# Patient Record
Sex: Female | Born: 1980 | Race: White | Hispanic: No | Marital: Single | Smoking: Former smoker
Health system: Southern US, Community
[De-identification: ages and names within clinical notes are randomized; demographics above are authoritative.]

## PROBLEM LIST (undated history)

## (undated) DIAGNOSIS — N200 Calculus of kidney: Secondary | ICD-10-CM

## (undated) DIAGNOSIS — K219 Gastro-esophageal reflux disease without esophagitis: Secondary | ICD-10-CM

## (undated) DIAGNOSIS — F319 Bipolar disorder, unspecified: Secondary | ICD-10-CM

## (undated) DIAGNOSIS — F209 Schizophrenia, unspecified: Secondary | ICD-10-CM

## (undated) DIAGNOSIS — N879 Dysplasia of cervix uteri, unspecified: Secondary | ICD-10-CM

## (undated) DIAGNOSIS — N289 Disorder of kidney and ureter, unspecified: Secondary | ICD-10-CM

## (undated) HISTORY — PX: KIDNEY STONE SURGERY: SHX686

## (undated) HISTORY — DX: Calculus of kidney: N20.0

## (undated) HISTORY — PX: FOOT SURGERY: SHX648

## (undated) HISTORY — DX: Dysplasia of cervix uteri, unspecified: N87.9

---

## 2002-05-27 ENCOUNTER — Other Ambulatory Visit: Admission: RE | Admit: 2002-05-27 | Discharge: 2002-05-27 | Payer: Self-pay

## 2004-02-22 ENCOUNTER — Ambulatory Visit (HOSPITAL_COMMUNITY): Admission: RE | Admit: 2004-02-22 | Discharge: 2004-02-22 | Payer: Self-pay | Admitting: Obstetrics and Gynecology

## 2004-03-16 ENCOUNTER — Ambulatory Visit (HOSPITAL_COMMUNITY): Admission: RE | Admit: 2004-03-16 | Discharge: 2004-03-16 | Payer: Self-pay | Admitting: Urology

## 2004-03-18 ENCOUNTER — Ambulatory Visit (HOSPITAL_COMMUNITY): Admission: RE | Admit: 2004-03-18 | Discharge: 2004-03-18 | Payer: Self-pay | Admitting: Urology

## 2004-03-30 ENCOUNTER — Ambulatory Visit (HOSPITAL_COMMUNITY): Admission: RE | Admit: 2004-03-30 | Discharge: 2004-03-30 | Payer: Self-pay | Admitting: Urology

## 2004-05-23 ENCOUNTER — Ambulatory Visit (HOSPITAL_COMMUNITY): Admission: RE | Admit: 2004-05-23 | Discharge: 2004-05-23 | Payer: Self-pay | Admitting: Urology

## 2004-09-24 IMAGING — CR DG ABDOMEN 1V
1 series · 1 of 1 positions shown · non-contrast
Comparison: none

CLINICAL DATA: Left renal calculus, preop.
 ABDOMEN, ONE VIEW ? 03/16/04
 There is a 17-mm stone projecting in the region of the left renal pelvis.  This is most compatible with a left renal calculus.  No additional calcifications are noted.  There is a normal bowel-gas pattern.  The bony structures are unremarkable.

[view not recorded]
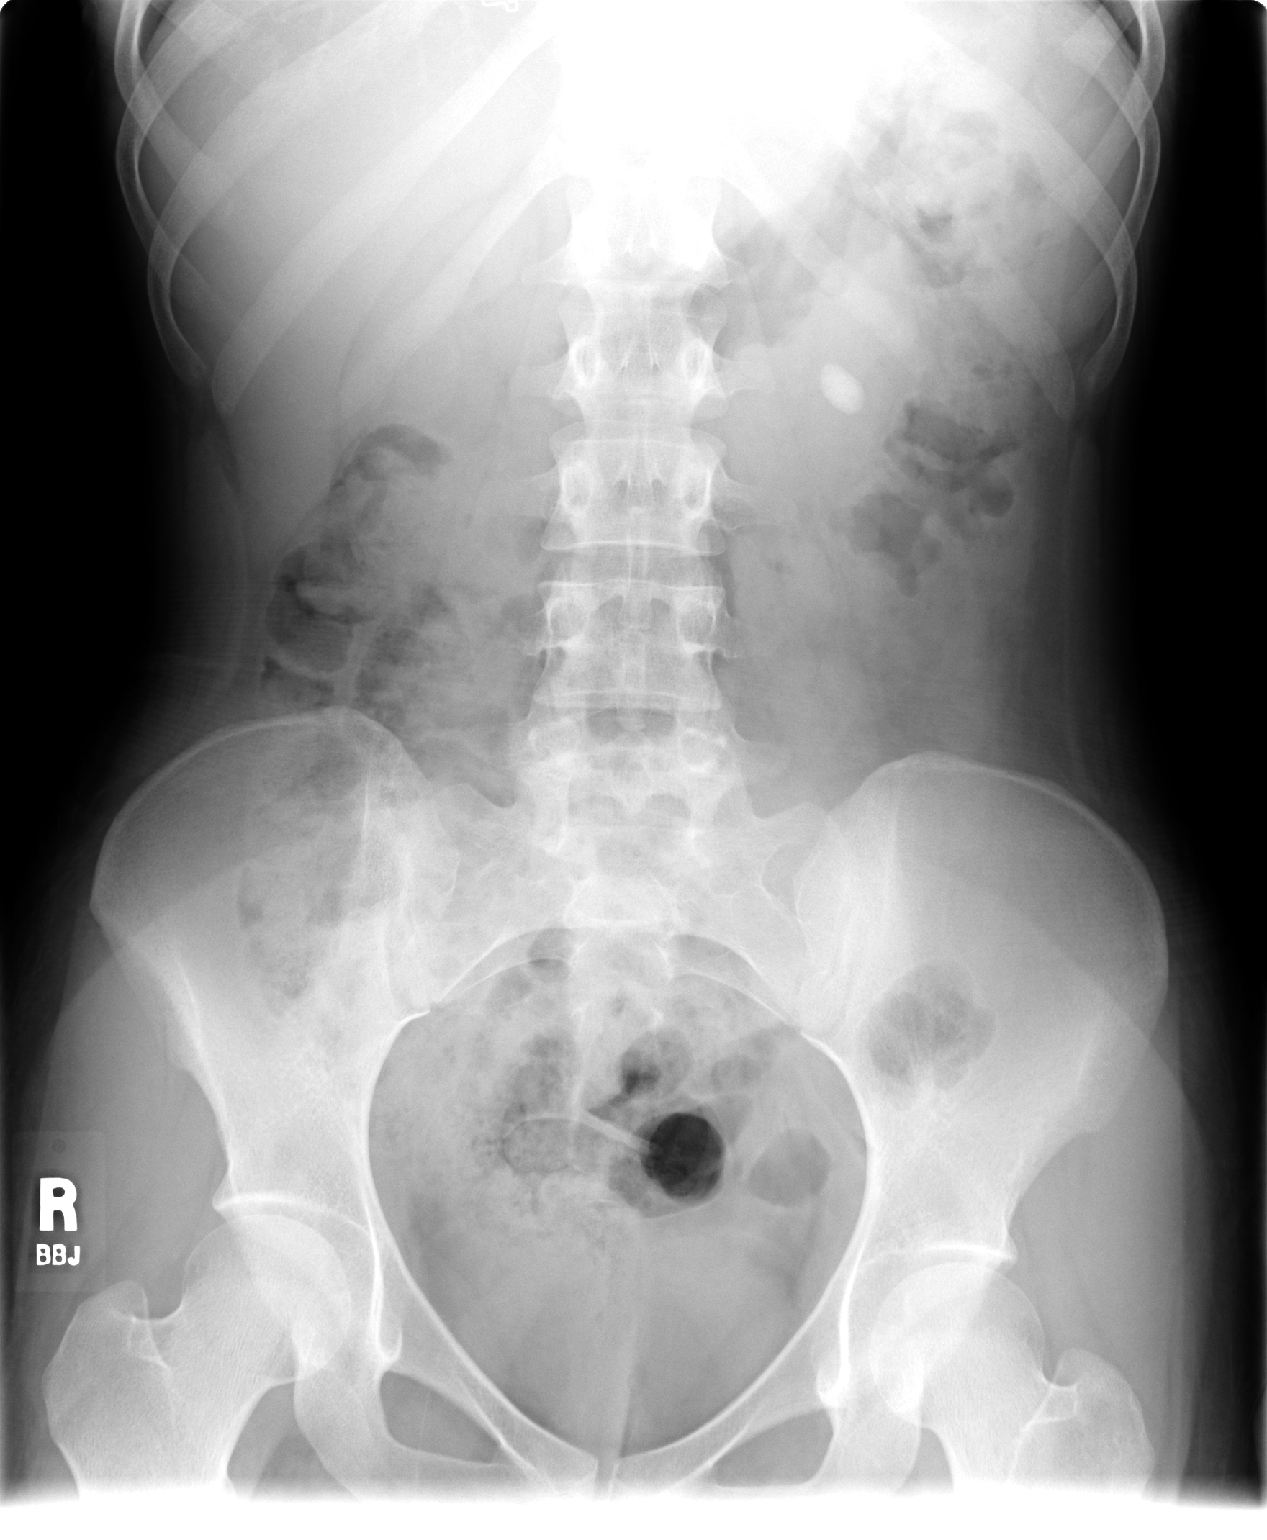

[1 of 1 positions shown; findings below may reference images not displayed]

IMPRESSION: A 17-mm stone projecting over the left kidney/renal pelvis.

## 2004-09-26 IMAGING — CR DG ABDOMEN 1V
1 series · 1 of 1 positions shown · non-contrast
Comparison: none

CLINICAL DATA: Left renal calculus.
 SINGLE VIEW ABDOMEN
 Comparison 03/16/04.
 Left renal calculus is no longer visible.  There are numerous tiny fragments in the distal ureter on the left (steinstrasse).  
 IMPRESSION
 Left lithotripsy with distal ureteral fragments on the left.

[view not recorded]
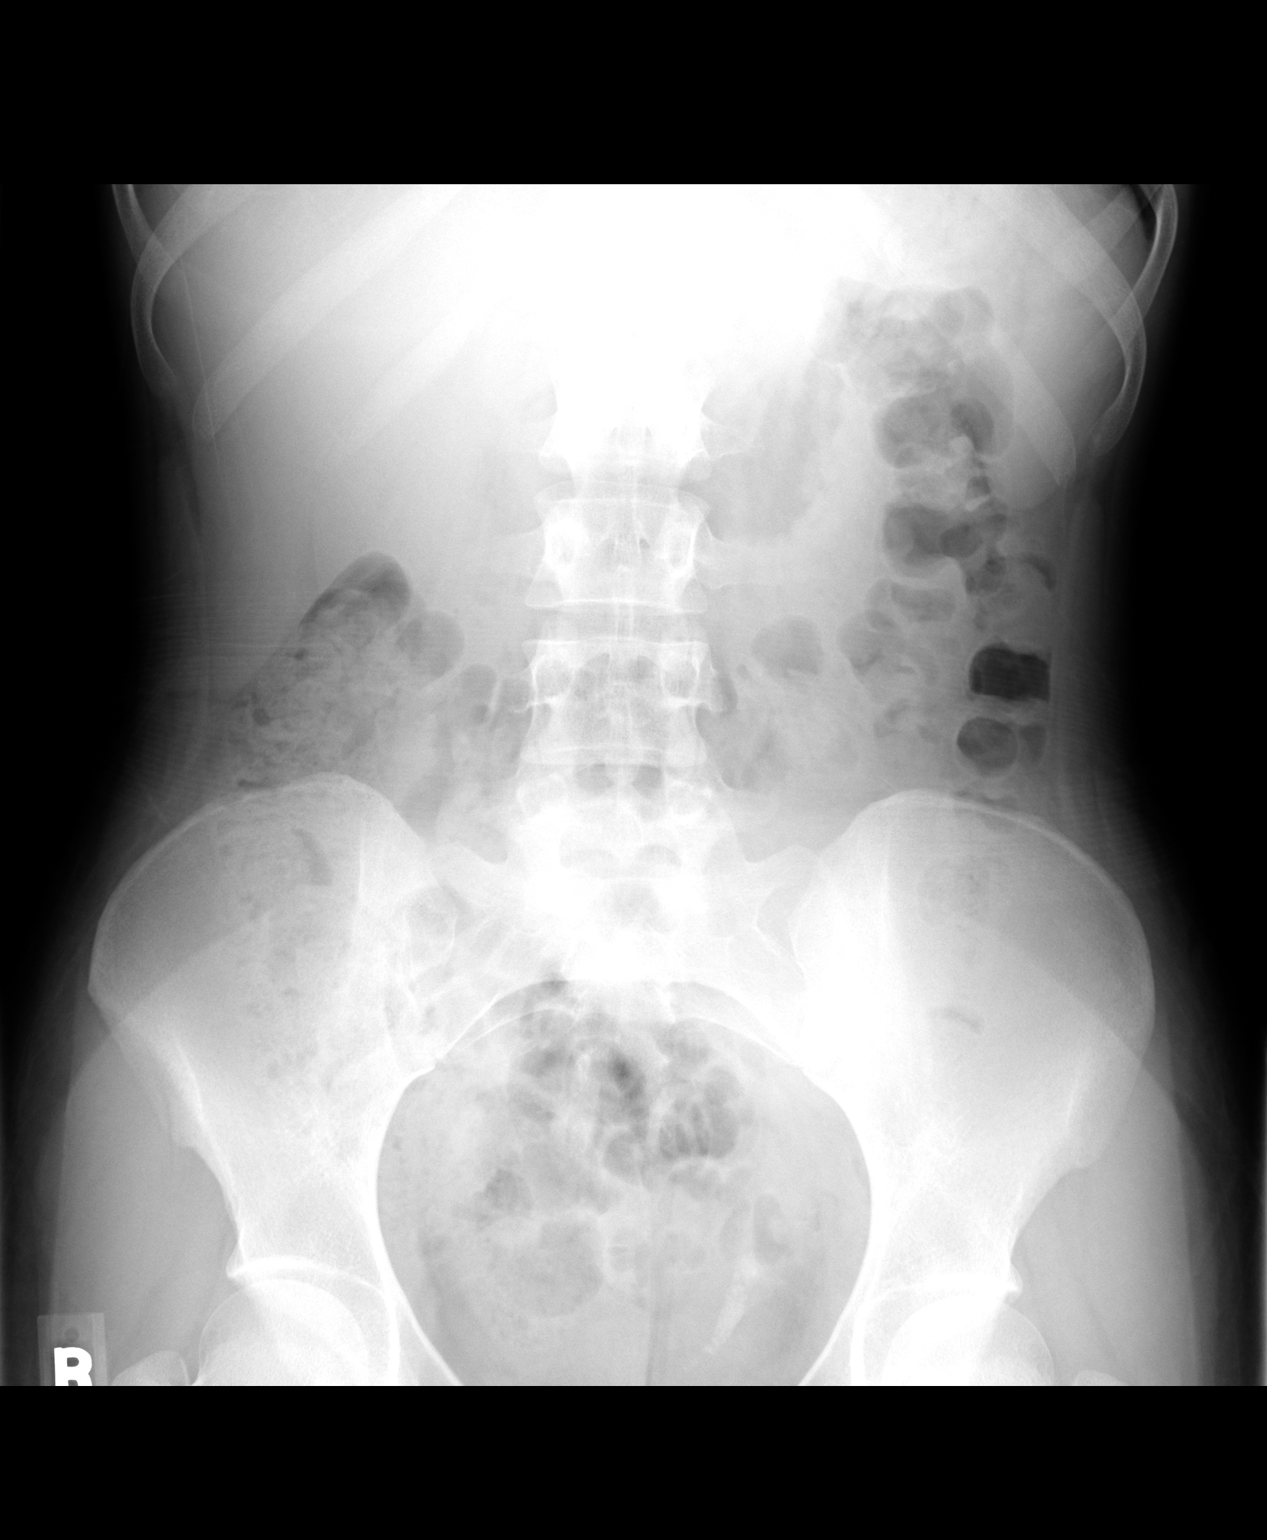

[1 of 1 positions shown; findings below may reference images not displayed]

## 2005-01-12 ENCOUNTER — Other Ambulatory Visit: Admission: RE | Admit: 2005-01-12 | Discharge: 2005-01-12 | Payer: Self-pay

## 2005-10-31 ENCOUNTER — Emergency Department (HOSPITAL_COMMUNITY): Admission: EM | Admit: 2005-10-31 | Discharge: 2005-10-31 | Payer: Self-pay | Admitting: Emergency Medicine

## 2005-12-19 ENCOUNTER — Ambulatory Visit (HOSPITAL_COMMUNITY): Admission: RE | Admit: 2005-12-19 | Discharge: 2005-12-19 | Payer: Self-pay

## 2007-02-15 ENCOUNTER — Ambulatory Visit (HOSPITAL_COMMUNITY): Admission: RE | Admit: 2007-02-15 | Discharge: 2007-02-15 | Payer: Self-pay | Admitting: Obstetrics and Gynecology

## 2007-04-09 ENCOUNTER — Ambulatory Visit: Payer: Self-pay | Admitting: Psychiatry

## 2007-04-10 ENCOUNTER — Inpatient Hospital Stay (HOSPITAL_COMMUNITY): Admission: AD | Admit: 2007-04-10 | Discharge: 2007-04-15 | Payer: Self-pay | Admitting: Psychiatry

## 2007-11-13 ENCOUNTER — Ambulatory Visit (HOSPITAL_COMMUNITY): Admission: RE | Admit: 2007-11-13 | Discharge: 2007-11-13 | Payer: Self-pay | Admitting: Obstetrics and Gynecology

## 2008-02-08 ENCOUNTER — Emergency Department (HOSPITAL_COMMUNITY): Admission: EM | Admit: 2008-02-08 | Discharge: 2008-02-09 | Payer: Self-pay | Admitting: Emergency Medicine

## 2009-08-03 ENCOUNTER — Encounter (INDEPENDENT_AMBULATORY_CARE_PROVIDER_SITE_OTHER): Payer: Self-pay | Admitting: Unknown Physician Specialty

## 2009-08-03 ENCOUNTER — Other Ambulatory Visit: Admission: RE | Admit: 2009-08-03 | Discharge: 2009-08-03 | Payer: Self-pay | Admitting: Unknown Physician Specialty

## 2010-07-20 ENCOUNTER — Emergency Department (HOSPITAL_COMMUNITY): Admission: EM | Admit: 2010-07-20 | Discharge: 2010-07-21 | Payer: Self-pay | Admitting: Emergency Medicine

## 2010-07-26 ENCOUNTER — Emergency Department (HOSPITAL_COMMUNITY): Admission: EM | Admit: 2010-07-26 | Discharge: 2010-07-27 | Payer: Self-pay | Admitting: Emergency Medicine

## 2010-09-29 ENCOUNTER — Emergency Department (HOSPITAL_COMMUNITY): Admission: EM | Admit: 2010-09-29 | Discharge: 2010-09-30 | Payer: Self-pay | Admitting: Emergency Medicine

## 2010-12-25 ENCOUNTER — Encounter: Payer: Self-pay | Admitting: Unknown Physician Specialty

## 2011-01-08 ENCOUNTER — Emergency Department (HOSPITAL_COMMUNITY)
Admission: EM | Admit: 2011-01-08 | Discharge: 2011-01-08 | Disposition: A | Discharge status: Home / Self Care | Payer: Medicaid Other | Attending: Emergency Medicine | Admitting: Emergency Medicine

## 2011-01-08 DIAGNOSIS — IMO0002 Reserved for concepts with insufficient information to code with codable children: Secondary | ICD-10-CM | POA: Insufficient documentation

## 2011-01-08 DIAGNOSIS — Y92009 Unspecified place in unspecified non-institutional (private) residence as the place of occurrence of the external cause: Secondary | ICD-10-CM | POA: Insufficient documentation

## 2011-01-08 DIAGNOSIS — W260XXA Contact with knife, initial encounter: Secondary | ICD-10-CM | POA: Insufficient documentation

## 2011-01-08 DIAGNOSIS — S61409A Unspecified open wound of unspecified hand, initial encounter: Secondary | ICD-10-CM | POA: Insufficient documentation

## 2011-01-08 DIAGNOSIS — M79609 Pain in unspecified limb: Secondary | ICD-10-CM | POA: Insufficient documentation

## 2011-01-18 ENCOUNTER — Emergency Department (HOSPITAL_COMMUNITY)
Admission: EM | Admit: 2011-01-18 | Discharge: 2011-01-18 | Disposition: A | Discharge status: Home / Self Care | Payer: Medicaid Other | Attending: Emergency Medicine | Admitting: Emergency Medicine

## 2011-01-18 DIAGNOSIS — Z4802 Encounter for removal of sutures: Secondary | ICD-10-CM | POA: Insufficient documentation

## 2011-02-15 LAB — URINALYSIS, ROUTINE W REFLEX MICROSCOPIC
Bilirubin Urine: NEGATIVE
Glucose, UA: NEGATIVE mg/dL
Ketones, ur: NEGATIVE mg/dL
Leukocytes, UA: NEGATIVE
Nitrite: NEGATIVE
Protein, ur: NEGATIVE mg/dL
Specific Gravity, Urine: 1.015 (ref 1.005–1.030)
Urobilinogen, UA: 0.2 mg/dL (ref 0.0–1.0)
pH: 7.5 (ref 5.0–8.0)

## 2011-02-15 LAB — URINE MICROSCOPIC-ADD ON

## 2011-02-15 LAB — POCT PREGNANCY, URINE: Preg Test, Ur: NEGATIVE

## 2011-02-16 LAB — RAPID URINE DRUG SCREEN, HOSP PERFORMED
Amphetamines: NOT DETECTED
Barbiturates: NOT DETECTED
Benzodiazepines: NOT DETECTED
Cocaine: NOT DETECTED
Opiates: NOT DETECTED
Tetrahydrocannabinol: NOT DETECTED

## 2011-02-16 LAB — POCT PREGNANCY, URINE: Preg Test, Ur: NEGATIVE

## 2011-02-16 LAB — CBC
HCT: 38.7 % (ref 36.0–46.0)
Hemoglobin: 13 g/dL (ref 12.0–15.0)
MCH: 31.3 pg (ref 26.0–34.0)
MCHC: 33.6 g/dL (ref 30.0–36.0)
MCV: 93.1 fL (ref 78.0–100.0)
Platelets: 296 10*3/uL (ref 150–400)
RBC: 4.16 MIL/uL (ref 3.87–5.11)
RDW: 14.5 % (ref 11.5–15.5)
WBC: 9.5 10*3/uL (ref 4.0–10.5)

## 2011-02-16 LAB — URINALYSIS, ROUTINE W REFLEX MICROSCOPIC
Bilirubin Urine: NEGATIVE
Glucose, UA: NEGATIVE mg/dL
Hgb urine dipstick: NEGATIVE
Ketones, ur: NEGATIVE mg/dL
Leukocytes, UA: NEGATIVE
Nitrite: POSITIVE — AB
Protein, ur: NEGATIVE mg/dL
Specific Gravity, Urine: 1.01 (ref 1.005–1.030)
Urobilinogen, UA: 0.2 mg/dL (ref 0.0–1.0)
pH: 6 (ref 5.0–8.0)

## 2011-02-16 LAB — BASIC METABOLIC PANEL
BUN: 9 mg/dL (ref 6–23)
CO2: 30 mEq/L (ref 19–32)
Calcium: 9 mg/dL (ref 8.4–10.5)
Chloride: 103 mEq/L (ref 96–112)
Creatinine, Ser: 0.78 mg/dL (ref 0.4–1.2)
GFR calc Af Amer: >60 mL/min (ref >60)
GFR calc non Af Amer: >60 mL/min (ref >60)
Glucose, Bld: 146 mg/dL — ABNORMAL HIGH (ref 70–99)
Potassium: 3.6 mEq/L (ref 3.5–5.1)
Sodium: 137 mEq/L (ref 135–145)

## 2011-02-16 LAB — DIFFERENTIAL
Basophils Absolute: 0 10*3/uL (ref 0.0–0.1)
Basophils Relative: 0 % (ref 0–1)
Eosinophils Absolute: 0.1 10*3/uL (ref 0.0–0.7)
Eosinophils Relative: 1 % (ref 0–5)
Lymphocytes Relative: 39 % (ref 12–46)
Lymphs Abs: 3.7 10*3/uL (ref 0.7–4.0)
Monocytes Absolute: 0.5 10*3/uL (ref 0.1–1.0)
Monocytes Relative: 5 % (ref 3–12)
Neutro Abs: 5.1 10*3/uL (ref 1.7–7.7)
Neutrophils Relative %: 54 % (ref 43–77)

## 2011-02-16 LAB — URINE MICROSCOPIC-ADD ON

## 2011-02-16 LAB — ETHANOL: Alcohol, Ethyl (B): <5 mg/dL (ref 0–10)

## 2011-02-17 LAB — URINE MICROSCOPIC-ADD ON

## 2011-02-17 LAB — POCT I-STAT, CHEM 8
BUN: 7 mg/dL (ref 6–23)
Calcium, Ion: 1.09 mmol/L — ABNORMAL LOW (ref 1.12–1.32)
Chloride: 102 mEq/L (ref 96–112)
Creatinine, Ser: 0.7 mg/dL (ref 0.4–1.2)
Glucose, Bld: 129 mg/dL — ABNORMAL HIGH (ref 70–99)
HCT: 45 % (ref 36.0–46.0)
Hemoglobin: 15.3 g/dL — ABNORMAL HIGH (ref 12.0–15.0)
Potassium: 4.1 mEq/L (ref 3.5–5.1)
Sodium: 138 mEq/L (ref 135–145)
TCO2: 28 mmol/L (ref 0–100)

## 2011-02-17 LAB — CBC
HCT: 41.3 % (ref 36.0–46.0)
Hemoglobin: 12.7 g/dL (ref 12.0–15.0)
Hemoglobin: 14.2 g/dL (ref 12.0–15.0)
MCH: 30.7 pg (ref 26.0–34.0)
MCH: 31.1 pg (ref 26.0–34.0)
MCHC: 33.5 g/dL (ref 30.0–36.0)
MCHC: 34.4 g/dL (ref 30.0–36.0)
MCV: 90.4 fL (ref 78.0–100.0)
MCV: 91.5 fL (ref 78.0–100.0)
RBC: 4.11 MIL/uL (ref 3.87–5.11)
RBC: 4.57 MIL/uL (ref 3.87–5.11)
RDW: 14.6 % (ref 11.5–15.5)

## 2011-02-17 LAB — DIFFERENTIAL
Eosinophils Absolute: 0 10*3/uL (ref 0.0–0.7)
Eosinophils Relative: 0 % (ref 0–5)
Lymphs Abs: 2.4 10*3/uL (ref 0.7–4.0)
Monocytes Absolute: 0.8 10*3/uL (ref 0.1–1.0)
Monocytes Relative: 11 % (ref 3–12)

## 2011-02-17 LAB — URINALYSIS, ROUTINE W REFLEX MICROSCOPIC
Bilirubin Urine: NEGATIVE
Glucose, UA: NEGATIVE mg/dL
Hgb urine dipstick: NEGATIVE
Ketones, ur: NEGATIVE mg/dL
Protein, ur: NEGATIVE mg/dL

## 2011-02-17 LAB — RAPID URINE DRUG SCREEN, HOSP PERFORMED
Barbiturates: NOT DETECTED
Benzodiazepines: NOT DETECTED
Opiates: NOT DETECTED

## 2011-02-17 LAB — ETHANOL: Alcohol, Ethyl (B): <5 mg/dL (ref 0–10)

## 2011-02-17 LAB — VALPROIC ACID LEVEL: Valproic Acid Lvl: 63.9 ug/mL (ref 50.0–100.0)

## 2011-04-21 NOTE — Discharge Summary (Signed)
Diana [name], [name]               ACCOUNT NO.:  [id number]   MEDICAL RECORD NO.:  [id number]          PATIENT TYPE:  IPS   LOCATION:  0504                          FACILITY:  [facility]   PHYSICIAN:  Geoffery Lyons, M.D.      DATE OF BIRTH:  08-31-1981   DATE OF ADMISSION:  04/10/2007  DATE OF DISCHARGE:  04/15/2007                               DISCHARGE SUMMARY   CHIEF COMPLAINT AND PRESENT ILLNESS:  This was the first admission to  [facility] for this 30 year old white female, single,  involuntarily committed.  She was brought to the ED by law enforcement.  Apparently, her sister called after she threatened to harm herself and  other members of the family with a knife.  She is developmentally  disabled, and she claimed that the sister had been calling her names,  and was threatening to leave her home alone, which upset her.  She took  a knife and pointed it towards her chest.  Admits that she had intent of  stabbing herself only.  Denied any thoughts of harming other members of  the family.  Apparently the mother left home to go to visit an aunt over  a week prior to this admission.  She misses her mother.  Threat of her  sister leaving her home alone.   PAST PSYCHIATRIC HISTORY:  Denies previous treatment.   ALCOHOL AND DRUG HISTORY:  Denies any active use of any substances.   MEDICAL HISTORY:  Noncontributory.   MEDICATIONS:  Oral contraceptives, vitamin D supplements.   PHYSICAL EXAMINATION:  Performed.  Failed to show any acute findings.   LABORATORY WORKUP:  Sodium 139, potassium 3.9, glucose 90, SGOT 19, SGPT  14, total bilirubin 0.4, TSH 4.965.   MENTAL STATUS EXAM:  A fully alert female, pleasant, cooperative,  directable, somewhat limited, concrete but able to articulate her needs.  Some of psychomotor retardation.  Mood is depressed.  Affect  constricted.  Upset because she is apart from her mother.  Claimed that  the sister was calling her names, and  she got upset in the course of the  interview.  Starts crying, saying I miss my mom, I miss my mom.  Denied any denied any suicidal thoughts.  No homicidal thoughts.  No  evidence of delusions.  No hallucinations.  Cognition well-preserved.   ADMISSION DIAGNOSES:  AXIS I:  Major depressive disorder.  AXIS II:  Mental disability.  AXIS III:  No diagnosis.  AXIS IV:  Moderate.  AXIS V:  Upon admission 25.  Highest global assessment of functioning in  the lat year 55.   COURSE IN THE HOSPITAL:  She was admitted.  She was started in  individual and group psychotherapy.  She was given Ambien for sleep,  Ativan as needed, and due to a history of persistent mood fluctuation,  she was started on Depakote.  As already stated, 30 year old female,  single, very upset when apparently the sister called her names.  The  mother left to visit an aunt.  Will be coming back.  Took a knife, put  it  to her chest, threatened suicide as well as to hurt the sister, and  burn the house.  Involuntary petition.  She endorsed depression, being  anxious, more irritable.  We continued to evaluate, get more  information.  She apparently got upset with the sister.  She was also  responding to the sister's children being around.  Has history of anger  spells.  She __________  goes to her own room and stays by herself, but  endorsed that as of lately has been getting more difficult to deal with  the anger.  We pursued the Depakote.  She tolerated it well.  We worked  on Pharmacologist, Building surveyor.  There were a couple instances when  the mother was going to pick up and ended up not happening.  Got very  upset but she was able to deal with that.  She was able to accept this  frustration without going and losing control.  By May 12, she was in  full contact with reality.  There were no active suicidal/homicidal  ideas.  No hallucinations.  No delusions.  She felt she was ready to  move on, to go home.  She had  tolerated the medication well.  No  evidence of outbursts.  Seemed to be euthymic, so we went ahead and  discharged to outpatient followup.   DISCHARGE DIAGNOSES:  AXIS I:  Mood disorder not otherwise specified.  Rule out borderline intellectual functioning.  AXIS II:  No diagnosis.  AXIS III:  No diagnosis.  AXIS IV:  Moderate.  AXIS V:  Upon discharge 50-55.   Discharged on Depakote ER 250 in the morning and at bedtime.   FOLLOWUP:  __________  [facility].      Geoffery Lyons, M.D.  Electronically Signed     IL/MEDQ  D:  05/12/2007  T:  05/13/2007  Job:  [id number]

## 2011-04-21 NOTE — H&P (Signed)
NAME:  Diana [name], Diana [name]                         ACCOUNT NO.:  [id number]   MEDICAL RECORD NO.:  [id number]                   PATIENT TYPE:  AMB   LOCATION:  DAY                                  FACILITY:  [facility]   PHYSICIAN:  Dennie Maizes, M.D.                DATE OF BIRTH:  05-06-1981   DATE OF ADMISSION:  03/16/2004  DATE OF DISCHARGE:                                HISTORY & PHYSICAL   CHIEF COMPLAINT:  1. Back pain.  2. Microhematuria.  3. History of urinary tract infection.   HISTORY OF PRESENT ILLNESS:  This 30 year old female has been referred to me  by Ms. Myriam Jacobson from [facility].  [name] has  been having intermittent dysuria, back pain and intermittent right flank  pain for the past two months.  She has been treated for a urinary tract  infection about six months ago.  She also complains of urinary frequency x6  and nocturia x2-3.  She has noticed intermittent gross hematuria.  She did  not have any fever or chills.  She has some voiding difficulty and she has  to strain to empty the bladder.  There is no past history of urolithiasis.   PAST MEDICAL HISTORY:  Unremarkable.   MEDICATIONS:  None.   ALLERGIES:  NONE.   OPERATIONS:  Status post leg surgery during infancy.   FAMILY HISTORY:  Positive for kidney stones.   PHYSICAL EXAMINATION:  Height 5 feet 3 inches, weight 99.5 pounds.  HEENT:  Normal.  NECK:  No masses.  LUNGS:  Clear to auscultation.  HEART:  Regular rate and rhythm, no murmurs.  ABDOMEN:  Soft, no palpable frank mass or CVA tenderness.  BLADDER:  Not palpable.  No suprapubic tenderness.   A recent urine culture and sensitivity is negative.  She was evaluated with  a noncontrast CT of the abdomen and pelvis.  An x-ray of the KUB area was  also done.  These x-rays reveal a 16 x 10 mm sized left renal calculus.  There is no evidence of hydronephrosis.   IMPRESSION:  1. Large left renal calculus.  2. Hematuria.  3.  History of urinary tract infection.   PLAN:  Extracorporeal shock wave lithotripsy of a large left renal calculus  with IV sedation in day hospital.  I discussed with the patient and her  mother regarding the diagnosis, operative details, alternate treatments,  outcome, possible risks and complications and they have agreed for the  procedure to be done.     ___________________________________________                                         Dennie Maizes, M.D.   SK/MEDQ  D:  03/15/2004  T:  03/15/2004  Job:  [id number]   cc:   Ms. Lupita Leash  [facility]  [address]  [place], [place] [address]

## 2011-04-21 NOTE — H&P (Signed)
NAMEMarland Kitchen  Diana [name], Diana [name]               ACCOUNT NO.:  [id number]   MEDICAL RECORD NO.:  [id number]          PATIENT TYPE:  EMS   LOCATION:  ED                            FACILITY:  [facility]   PHYSICIAN:  Geoffery Lyons, M.D.      DATE OF BIRTH:  1981/09/02   DATE OF ADMISSION:  04/09/2007  DATE OF DISCHARGE:  04/10/2007                       PSYCHIATRIC ADMISSION ASSESSMENT   DATE OF ASSESSMENT:  Apr 10, 2007 at 10:10.   IDENTIFYING INFORMATION:  This is a 30 year old white female who is  single.  This is an involuntary admission.   HISTORY OF THE PRESENT ILLNESS:  This patient was petitioned by the  emergency room physician after she was brought there by law enforcement.  Her sister, Wyatt Mage, had apparently called the police after the patient  threatened to harm herself and other members of family with a knife.  The patient is developmentally disabled and reports today that her  sister had been calling her names and was threatening to leave her home  alone which upset her.  She took a knife and pointed it towards her  chest and admits that she had intent of stabbing herself and harming  herself.  She did not.  She denies any thoughts of harming other members  of the family.  She reports that her mother left home to go visit an  aunt about a week ago, that she misses her mother very much.  She is  afraid of her sister leaving her home alone.  She cries and says, I  miss my mother and reports that her mother will not be home until  Thursday.  She has been calm, cooperative, and directable with staff.  She is an unreliable historian secondary to her a poor insight and  developmental delay but is adequate in expressing her needs.   PAST PSYCHIATRIC HISTORY:  The patient denies any history of psychiatric  treatment.  No history of taking neuroleptics.  Has been enrolled in  Special Education throughout high school, now does a part-time job.  No  history of a substance abuse.  No history of  suicide attempts or acting  out or combativeness in the past.   SOCIAL HISTORY:  The patient is a single 30 year old female, never  married.  No children.  [place]  Layaan Mott, her adult sister,  who has three children in the home  and her mother who is currently absent on a visit to another relative.  The patient denies any legal problems.  Does not drive.  Walks to work.  Has her own room in the home.  Denies any history of substance abuse.  Her mother is her legal guardian.   FAMILY HISTORY:  Unavailable.   ALCOHOL AND DRUG HISTORY:  None.   MEDICAL HISTORY:  The patient is followed by Lupita Leash, a nurse practitioner  at a medical clinic, the identity is unclear.  Medical problems are none  known.   MEDICATIONS:  1. Oral contraceptive tablets which she takes one daily.  2. Vitamin D supplements dose unclear.   DRUG ALLERGIES:  IBUPROFEN  WHICH HAS BEEN KNOWN TO GIVE HER A RASH.   POSITIVE PHYSICAL FINDINGS:  This patient's full physical exam was done  in the emergency room at [facility].  She is a well-  nourished, well-developed female who is in no acute distress.  Her vital  signs were completely within normal limits.  She has a slight build,  healthy-appearing female, in no distress.  Pleasant, cooperative.  Five  feet two inches tall, 101 pounds, temperature 97, pulse 76, respirations  18, blood pressure 104/71.  No physical complaints today.   DIAGNOSTIC STUDIES:  CBC:  WBC 6.8, hemoglobin 12.5, hematocrit 35.7,  platelets 266,000.  Chemistry:  Sodium 139, potassium 3.9, chloride 102,  carbon dioxide 30, BUN 6, creatinine 0.75, and random glucose is 90.  Liver enzymes are all within normal limits.  Calcium normal.  TSH is  currently pending.  Her urine pregnancy test was negative.  Urine drug  screen negative for all substances.  Routine UA is unremarkable.   MENTAL STATUS EXAM:  A fully alert female.  She is  pleasant,  cooperative, directable, is quite limited but is polite, articulates her  needs.  Cognition is intact to person, place, and situation.  Does have  some limited insight and her thinking is very concrete.  She is, for  example, able to state her name and address, where she works,  does not  know the name of the medical clinic, or the last name of the nurse  practitioner that she sees, is not able to state her home phone number,  speech is slowed in pace and tone, normal in production, shy, thinks  things through quite a bit before she answers does require some  prompting but answers all questions, eye contact is quite good, mood is  depressed.  She is quite upset at being apart from her mother, and as  she talks about the events of yesterday she says that her sister was  calling her some names and yelling at her which got her upset and then  as she discusses this she cries more, just repeats, I miss my mom.  I  miss my mom.  She is not suicidal today.  Has been cooperative.  Feels  all right about staying here with [place] a couple days to sort the situation  out, rule out possible major depression, no homicidal thought, no  agitation, no signs of combativeness here, fully cooperative.   AXIS I:  Rule out major depression versus acute adjustment reaction.  AXIS II:  Deferred.  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  AXIS III:  No diagnosis.  AXIS IV:  Severe stress with change in primary caregiver.  AXIS V:  Current 23 past year 55 estimated.   PLAN:  To voluntarily admit the patient with every 15-minute checks in  place.  We  have given her Ambien 5 mg at hour of sleep p.r.n. for  insomnia if she needs it.  We are going to start by talking with her  family members and determine if she does have a long history of  dysphoric mood or other problems or if this is a relative recent reaction to current circumstances.  We will, after getting some more  information, we  can make a determination if we need to treat her for  depression.  We will start with calling her sister, Wyatt Mage, who is  currently at home and we will contact her legal guardian who is her  mother.  Estimated length of stay is 3-5 days.      Margaret A. Scott, N.P.      Geoffery Lyons, M.D.  Electronically Signed    MAS/MEDQ  D:  04/10/2007  T:  04/10/2007  Job:  [id number]

## 2011-08-28 LAB — RAPID URINE DRUG SCREEN, HOSP PERFORMED
Amphetamines: NOT DETECTED
Barbiturates: NOT DETECTED
Benzodiazepines: NOT DETECTED
Tetrahydrocannabinol: NOT DETECTED

## 2011-08-28 LAB — DIFFERENTIAL
Eosinophils Relative: 1
Lymphocytes Relative: 32
Lymphs Abs: 2.9
Monocytes Absolute: 0.7

## 2011-08-28 LAB — URINALYSIS, ROUTINE W REFLEX MICROSCOPIC
Glucose, UA: NEGATIVE
Hgb urine dipstick: NEGATIVE
Ketones, ur: NEGATIVE
Leukocytes, UA: NEGATIVE
Protein, ur: NEGATIVE

## 2011-08-28 LAB — URINE MICROSCOPIC-ADD ON

## 2011-08-28 LAB — CBC
HCT: 36.6
Hemoglobin: 12.6
WBC: 9

## 2011-08-28 LAB — BASIC METABOLIC PANEL
GFR calc non Af Amer: >60
Potassium: 3.8
Sodium: 137

## 2011-08-28 LAB — ETHANOL: Alcohol, Ethyl (B): <5

## 2011-12-03 ENCOUNTER — Encounter: Payer: Self-pay | Admitting: Emergency Medicine

## 2011-12-03 ENCOUNTER — Emergency Department (HOSPITAL_COMMUNITY)
Admission: EM | Admit: 2011-12-03 | Discharge: 2011-12-03 | Disposition: A | Discharge status: Home / Self Care | Payer: Medicaid Other | Attending: Emergency Medicine | Admitting: Emergency Medicine

## 2011-12-03 DIAGNOSIS — F319 Bipolar disorder, unspecified: Secondary | ICD-10-CM | POA: Insufficient documentation

## 2011-12-03 DIAGNOSIS — R070 Pain in throat: Secondary | ICD-10-CM | POA: Insufficient documentation

## 2011-12-03 DIAGNOSIS — R05 Cough: Secondary | ICD-10-CM | POA: Insufficient documentation

## 2011-12-03 DIAGNOSIS — R509 Fever, unspecified: Secondary | ICD-10-CM | POA: Insufficient documentation

## 2011-12-03 DIAGNOSIS — R059 Cough, unspecified: Secondary | ICD-10-CM | POA: Insufficient documentation

## 2011-12-03 DIAGNOSIS — Z79899 Other long term (current) drug therapy: Secondary | ICD-10-CM | POA: Insufficient documentation

## 2011-12-03 DIAGNOSIS — F840 Autistic disorder: Secondary | ICD-10-CM | POA: Insufficient documentation

## 2011-12-03 DIAGNOSIS — J111 Influenza due to unidentified influenza virus with other respiratory manifestations: Secondary | ICD-10-CM | POA: Insufficient documentation

## 2011-12-03 HISTORY — DX: Bipolar disorder, unspecified: F31.9

## 2011-12-03 HISTORY — DX: Schizophrenia, unspecified: F20.9

## 2011-12-03 HISTORY — DX: Disorder of kidney and ureter, unspecified: N28.9

## 2011-12-03 LAB — RAPID STREP SCREEN (MED CTR MEBANE ONLY): Streptococcus, Group A Screen (Direct): NEGATIVE

## 2011-12-03 MED ORDER — OSELTAMIVIR PHOSPHATE 75 MG PO CAPS: 75.0000 mg | ORAL_CAPSULE | Freq: Two times a day (BID) | ORAL | Status: AC

## 2011-12-03 NOTE — ED Notes (Signed)
Pt a/o. Resp even and unlabored. NAD at this time. D/C instructions reviewed with pt and family member. Family member verbalized understanding. Pt ambulated to lobby with steady gate.

## 2011-12-03 NOTE — ED Provider Notes (Signed)
History     CSN: [id number]  Arrival date & time 12/03/11  1253   First MD Initiated Contact with Patient 12/03/11 1604      Chief Complaint  Patient presents with  . Cough  . Sore Throat  . Fever    (Consider location/radiation/quality/duration/timing/severity/associated sxs/prior treatment) Patient is a 30 y.o. female presenting with cough, pharyngitis, and fever. The history is provided by a parent (the patient's mother states the patient has had a cough and sore throat for one to 2 days with some aches chills.).  Cough This is a new problem. The current episode started yesterday. The problem occurs constantly. The problem has not changed since onset.The cough is non-productive. There has been no fever. Associated symptoms include chills and sore throat. Pertinent negatives include no chest pain, no ear pain and no headaches. She has tried nothing for the symptoms. She is not a smoker. Her past medical history does not include bronchitis or pneumonia.  Sore Throat This is a new problem. The current episode started yesterday. The problem occurs constantly. Pertinent negatives include no chest pain, no abdominal pain and no headaches. The symptoms are aggravated by nothing. The symptoms are relieved by nothing.  Fever Primary symptoms of the febrile illness include fever and cough. Primary symptoms do not include fatigue, headaches, abdominal pain, diarrhea or rash.    Past Medical History  Diagnosis Date  . Bipolar 1 disorder   . Renal disorder   . Autistic disorder   . Schizophrenia     History reviewed. No pertinent past surgical history.  History reviewed. No pertinent family history.  History  Substance Use Topics  . Smoking status: Never Smoker   . Smokeless tobacco: Never Used  . Alcohol Use: No    OB History    Grav Para Term Preterm Abortions TAB SAB Ect Mult Living            0      Review of Systems  Constitutional: Positive for fever and chills.  Negative for fatigue.  HENT: Positive for sore throat. Negative for ear pain, congestion, sinus pressure and ear discharge.   Eyes: Negative for discharge.  Respiratory: Positive for cough.   Cardiovascular: Negative for chest pain.  Gastrointestinal: Negative for abdominal pain and diarrhea.  Genitourinary: Negative for frequency and hematuria.  Musculoskeletal: Negative for back pain.  Skin: Negative for rash.  Neurological: Negative for seizures and headaches.  Hematological: Negative.   Psychiatric/Behavioral: Negative for hallucinations.    Allergies  Review of patient's allergies indicates not on file.  Home Medications   Current Outpatient Rx  Name Route Sig Dispense Refill  . ARIPIPRAZOLE 15 MG PO TABS Oral Take 15 mg by mouth daily.      Marland Kitchen CALCIUM CARBONATE 600 MG PO TABS Oral Take 600 mg by mouth daily.      Marland Kitchen CLONAZEPAM 0.5 MG PO TABS Oral Take 0.5 mg by mouth at bedtime.      Marland Kitchen DIVALPROEX SODIUM ER 250 MG PO TB24 Oral Take 250 mg by mouth daily.      . OSELTAMIVIR PHOSPHATE 75 MG PO CAPS Oral Take 1 capsule (75 mg total) by mouth every 12 (twelve) hours. 10 capsule 0    BP 115/64  Pulse 100  Temp(Src) 98.6 F (37 C) (Oral)  Resp 20  Ht 5\' 4"  (1.626 m)  Wt 145 lb (65.772 kg)  BMI 24.89 kg/m2  SpO2 98%  LMP 11/28/2011  Physical Exam  Constitutional: She is  oriented to person, place, and time. She appears well-developed.  HENT:  Head: Normocephalic and atraumatic.       Pharynx mildly inflamed  Eyes: Conjunctivae and EOM are normal. No scleral icterus.  Neck: Neck supple. No thyromegaly present.  Cardiovascular: Normal rate and regular rhythm.  Exam reveals no gallop and no friction rub.   No murmur heard. Pulmonary/Chest: No stridor. She has no wheezes. She has no rales. She exhibits no tenderness.  Abdominal: She exhibits no distension. There is no tenderness. There is no rebound.  Musculoskeletal: Normal range of motion. She exhibits no edema.    Lymphadenopathy:    She has no cervical adenopathy.  Neurological: She is oriented to person, place, and time. Coordination normal.  Skin: No rash noted. No erythema.  Psychiatric: She has a normal mood and affect. Her behavior is normal.    ED Course  Procedures (including critical care time)   Labs Reviewed  RAPID STREP SCREEN   No results found.   1. Influenza       MDM        and  Benny Lennert, MD 12/03/11 [phone]

## 2011-12-03 NOTE — ED Notes (Signed)
Patient c/o cough, sore throat, nasal congestion, and low grade fevers x3 days. Per step-mother patient's sputum thin and clear.

## 2011-12-09 ENCOUNTER — Emergency Department (HOSPITAL_COMMUNITY): Payer: Medicaid Other

## 2011-12-09 ENCOUNTER — Emergency Department (HOSPITAL_COMMUNITY)
Admission: EM | Admit: 2011-12-09 | Discharge: 2011-12-09 | Disposition: A | Discharge status: Home / Self Care | Payer: Medicaid Other | Attending: Emergency Medicine | Admitting: Emergency Medicine

## 2011-12-09 ENCOUNTER — Encounter (HOSPITAL_COMMUNITY): Payer: Self-pay | Admitting: *Deleted

## 2011-12-09 DIAGNOSIS — F840 Autistic disorder: Secondary | ICD-10-CM | POA: Insufficient documentation

## 2011-12-09 DIAGNOSIS — J40 Bronchitis, not specified as acute or chronic: Secondary | ICD-10-CM | POA: Insufficient documentation

## 2011-12-09 DIAGNOSIS — F319 Bipolar disorder, unspecified: Secondary | ICD-10-CM | POA: Insufficient documentation

## 2011-12-09 DIAGNOSIS — J111 Influenza due to unidentified influenza virus with other respiratory manifestations: Secondary | ICD-10-CM | POA: Insufficient documentation

## 2011-12-09 MED ORDER — IPRATROPIUM BROMIDE 0.02 % IN SOLN
0.5000 mg | Freq: Once | RESPIRATORY_TRACT | Status: AC
  Administered 2011-12-09: 0.5 mg via RESPIRATORY_TRACT
  Filled 2011-12-09: qty 2.5

## 2011-12-09 MED ORDER — AMOXICILLIN 500 MG PO CAPS: 500.0000 mg | ORAL_CAPSULE | Freq: Three times a day (TID) | ORAL | Status: AC

## 2011-12-09 MED ORDER — ALBUTEROL SULFATE HFA 108 (90 BASE) MCG/ACT IN AERS: 2.0000 | INHALATION_SPRAY | RESPIRATORY_TRACT | Status: DC | PRN

## 2011-12-09 MED ORDER — PREDNISONE 20 MG PO TABS
60.0000 mg | ORAL_TABLET | Freq: Once | ORAL | Status: AC
  Administered 2011-12-09: 60 mg via ORAL
  Filled 2011-12-09: qty 3

## 2011-12-09 MED ORDER — AMOXICILLIN 250 MG PO CAPS
1000.0000 mg | ORAL_CAPSULE | Freq: Once | ORAL | Status: AC
  Administered 2011-12-09: 1000 mg via ORAL
  Filled 2011-12-09: qty 4

## 2011-12-09 MED ORDER — PREDNISONE 50 MG PO TABS: 50.0000 mg | ORAL_TABLET | Freq: Every day | ORAL | Status: DC

## 2011-12-09 MED ORDER — IPRATROPIUM BROMIDE 0.02 % IN SOLN
RESPIRATORY_TRACT | Status: AC
  Filled 2011-12-09: qty 2.5

## 2011-12-09 MED ORDER — ALBUTEROL SULFATE (5 MG/ML) 0.5% IN NEBU
2.5000 mg | INHALATION_SOLUTION | Freq: Once | RESPIRATORY_TRACT | Status: AC
  Administered 2011-12-09: 2.5 mg via RESPIRATORY_TRACT
  Filled 2011-12-09: qty 0.5

## 2011-12-09 NOTE — ED Provider Notes (Signed)
History   This chart was scribed for Dione Booze, MD by Charolett Bumpers . The patient was seen in room APA03/APA03 and the patient's care was started at 11:45am.   CSN: [id number]  Arrival date & time 12/09/11  1005   First MD Initiated Contact with Patient 12/09/11 1140      Chief Complaint  Patient presents with  . Cough    (Consider location/radiation/quality/duration/timing/severity/associated sxs/prior treatment) HPI Diana Ho is a 31 y.o. female who presents to the Emergency Department complaining of a constant, moderate non-productive cough lasting for the past week with associated headache, nausea, vomiting, body aches, SOB, fever (102), chills and sweats. She was previously diagnosed with the flu 5 days ago, but symptoms have not been improving with treatment. Patient has not been eating normally, and has noticed more crackles in her cough. Patient states that nothing makes symptoms worse or better. She had sick contacts at home, and did not receive a flu shot.   PCP: [facility].  Past Medical History  Diagnosis Date  . Bipolar 1 disorder   . Renal disorder   . Autistic disorder   . Schizophrenia     History reviewed. No pertinent past surgical history.  History reviewed. No pertinent family history.  History  Substance Use Topics  . Smoking status: Never Smoker   . Smokeless tobacco: Never Used  . Alcohol Use: No    OB History    Grav Para Term Preterm Abortions TAB SAB Ect Mult Living            0      Review of Systems A complete 10 system review of systems was obtained and is otherwise negative except as noted in the HPI and PMH.   Allergies  Review of patient's allergies indicates no known allergies.  Home Medications   Current Outpatient Rx  Name Route Sig Dispense Refill  . ARIPIPRAZOLE 15 MG PO TABS Oral Take 15 mg by mouth daily.      Marland Kitchen CALCIUM CARBONATE 600 MG PO TABS Oral Take 600 mg by mouth daily.      Marland Kitchen  CLONAZEPAM 0.5 MG PO TABS Oral Take 0.5 mg by mouth at bedtime.      Marland Kitchen DIVALPROEX SODIUM ER 250 MG PO TB24 Oral Take 250 mg by mouth daily.      . OSELTAMIVIR PHOSPHATE 75 MG PO CAPS Oral Take 1 capsule (75 mg total) by mouth every 12 (twelve) hours. 10 capsule 0    BP 119/70  Pulse 106  Temp(Src) 99 F (37.2 C) (Oral)  Resp 18  Wt 144 lb (65.318 kg)  SpO2 99%  LMP 11/28/2011  Physical Exam  Nursing note and vitals reviewed. Constitutional: She is oriented to person, place, and time. She appears well-developed and well-nourished. No distress.  HENT:  Head: Normocephalic and atraumatic.  Eyes: EOM are normal. Pupils are equal, round, and reactive to light.  Neck: Neck supple. No tracheal deviation present.  Cardiovascular: Normal rate, regular rhythm, normal heart sounds and intact distal pulses.   Pulmonary/Chest: Effort normal. No respiratory distress. She has wheezes. She has rales.       Few rales at right base, prolonged exhalation phase with wheezes  Abdominal: Soft. She exhibits no distension.  Musculoskeletal: Normal range of motion. She exhibits no edema.  Neurological: She is alert and oriented to person, place, and time. No sensory deficit.  Skin: Skin is warm and dry.  Psychiatric: She has a normal  mood and affect. Her behavior is normal.    ED Course  Procedures (including critical care time)  DIAGNOSTIC STUDIES: Oxygen Saturation is 98% on room air, normal by my interpretation.    COORDINATION OF CARE:  Results for orders placed during the hospital encounter of 12/03/11  RAPID STREP SCREEN      Component Value Range   Streptococcus, Group A Screen (Direct) NEGATIVE  NEGATIVE    Dg Chest 2 View  12/09/2011  *RADIOLOGY REPORT*  Clinical Data: Cough, fever, chills.  CHEST - 2 VIEW  Comparison: 09/29/2010.  Findings: Normal sized heart.  Clear lungs. Minimal central peribronchial thickening.  Unremarkable bones.  IMPRESSION: Minimal bronchitic changes.  Original  Report Authenticated By: Darrol Angel, M.D.       Labs Reviewed - No data to display Dg Chest 2 View  12/09/2011  *RADIOLOGY REPORT*  Clinical Data: Cough, fever, chills.  CHEST - 2 VIEW  Comparison: 09/29/2010.  Findings: Normal sized heart.  Clear lungs. Minimal central peribronchial thickening.  Unremarkable bones.  IMPRESSION: Minimal bronchitic changes.  Original Report Authenticated By: Darrol Angel, M.D.     No diagnosis found.  She's given albuterol with Atrovent nebulizer treatment with significant subjective improvement. On auscultation, lungs are now clear. At this point, it is felt that she has a bacterial superinfection superimposed on influenza. She will be sent home with prescription for prednisone 50 mg daily for 5 days, albuterol inhaler to use 2 puffs every 4 hours as needed for coughing, and amoxicillin 500 mg 3 times a day for 10 days.  MDM  Clinically, appears to have a bronchitis or pneumonia superimposed upon episode of influenza. Chest x-ray or be obtained to rule out pneumonia and she will be started on antibiotics. She will be given an albuterol nebulizer treatment to evaluate how well it helps her cough. Prior ED record was reviewed and it was noted that she was sent home on Tamiflu.  I personally performed the services described in this documentation, which was scribed in my presence. The recorded information has been reviewed and considered.          Dione Booze, MD 12/09/11 [phone]

## 2011-12-09 NOTE — ED Notes (Signed)
Pt c/o headache, cough, chills, and not eating. Pt seen in ED last Sunday for same. Pt states that she is coughing up yellow phlegm.

## 2011-12-09 NOTE — ED Notes (Signed)
Given Po trial of ginger ale with ice.  Pt sipping fluids without difficulty.

## 2012-02-09 ENCOUNTER — Encounter: Payer: Self-pay | Admitting: Family Medicine

## 2012-02-09 ENCOUNTER — Ambulatory Visit (INDEPENDENT_AMBULATORY_CARE_PROVIDER_SITE_OTHER): Payer: Medicaid Other | Admitting: Family Medicine

## 2012-02-09 VITALS — BP 112/78 | HR 102 | Resp 15 | Ht 64.0 in | Wt 152.4 lb

## 2012-02-09 DIAGNOSIS — M549 Dorsalgia, unspecified: Secondary | ICD-10-CM

## 2012-02-09 DIAGNOSIS — F319 Bipolar disorder, unspecified: Secondary | ICD-10-CM

## 2012-02-09 DIAGNOSIS — E663 Overweight: Secondary | ICD-10-CM

## 2012-02-09 DIAGNOSIS — Z202 Contact with and (suspected) exposure to infections with a predominantly sexual mode of transmission: Secondary | ICD-10-CM

## 2012-02-09 DIAGNOSIS — Z79899 Other long term (current) drug therapy: Secondary | ICD-10-CM

## 2012-02-09 DIAGNOSIS — E669 Obesity, unspecified: Secondary | ICD-10-CM | POA: Insufficient documentation

## 2012-02-09 DIAGNOSIS — N912 Amenorrhea, unspecified: Secondary | ICD-10-CM

## 2012-02-09 DIAGNOSIS — Z7251 High risk heterosexual behavior: Secondary | ICD-10-CM

## 2012-02-09 DIAGNOSIS — R1310 Dysphagia, unspecified: Secondary | ICD-10-CM

## 2012-02-09 MED ORDER — RANITIDINE HCL 150 MG PO TABS: 150.0000 mg | ORAL_TABLET | Freq: Two times a day (BID) | ORAL | Status: DC

## 2012-02-09 NOTE — Patient Instructions (Addendum)
Get the lab work done fasting  Get the x-ray when you get the ultrasound done Ultrasound to look at her uterus Continue current medications  I will get records from health dept I will send Dr. Ladona Ridgel information  Start the zantac for hearburn symptoms F/U 3 weeks to follow-up

## 2012-02-10 LAB — LIPID PANEL
Cholesterol: 186 mg/dL (ref 0–200)
HDL: 50 mg/dL (ref >39)
LDL Cholesterol: 115 mg/dL — ABNORMAL HIGH (ref 0–99)
Total CHOL/HDL Ratio: 3.7 ratio
Triglycerides: 104 mg/dL (ref <150)
VLDL: 21 mg/dL (ref 0–40)

## 2012-02-10 LAB — SYPHILIS: RPR W/REFLEX TO RPR TITER AND TREPONEMAL ANTIBODIES, TRADITIONAL SCREENING AND DIAGNOSIS ALGORITHM

## 2012-02-10 LAB — COMPREHENSIVE METABOLIC PANEL
ALT: 19 U/L (ref 0–35)
BUN: 14 mg/dL (ref 6–23)
CO2: 29 mEq/L (ref 19–32)
Calcium: 9.7 mg/dL (ref 8.4–10.5)
Chloride: 104 mEq/L (ref 96–112)
Creat: 0.9 mg/dL (ref 0.50–1.10)

## 2012-02-10 LAB — CBC
HCT: 46.8 % — ABNORMAL HIGH (ref 36.0–46.0)
Hemoglobin: 15.1 g/dL — ABNORMAL HIGH (ref 12.0–15.0)
MCH: 30.5 pg (ref 26.0–34.0)
MCHC: 32.3 g/dL (ref 30.0–36.0)
MCV: 94.5 fL (ref 78.0–100.0)
Platelets: 217 K/uL (ref 150–400)
RBC: 4.95 MIL/uL (ref 3.87–5.11)
RDW: 13.6 % (ref 11.5–15.5)
WBC: 13.2 K/uL — ABNORMAL HIGH (ref 4.0–10.5)

## 2012-02-10 LAB — LUTEINIZING HORMONE: LH: 22.7 m[IU]/mL

## 2012-02-10 LAB — GC/CHLAMYDIA PROBE AMP, URINE: Chlamydia, Swab/Urine, PCR: NEGATIVE

## 2012-02-10 LAB — HIV ANTIBODY (ROUTINE TESTING W REFLEX): HIV: NONREACTIVE

## 2012-02-10 LAB — FOLLICLE STIMULATING HORMONE: FSH: 10.7 m[IU]/mL

## 2012-02-11 ENCOUNTER — Encounter: Payer: Self-pay | Admitting: Family Medicine

## 2012-02-11 DIAGNOSIS — Z7251 High risk heterosexual behavior: Secondary | ICD-10-CM | POA: Insufficient documentation

## 2012-02-11 LAB — TSH: TSH: 2.167 u[IU]/mL (ref 0.350–4.500)

## 2012-02-11 NOTE — Assessment & Plan Note (Signed)
Obtain thoracic x-ray

## 2012-02-11 NOTE — Assessment & Plan Note (Signed)
She has some dyphagia with regards to liquids, also ? Heartburn or reflux with her cough though this does not explain the snoring symptoms at night. Will start on low dose H2 blocker. May need GI

## 2012-02-11 NOTE — Assessment & Plan Note (Signed)
No recent problems with sexual activity. Obtain Urine GC/chlamydia, HIV to be done

## 2012-02-11 NOTE — Assessment & Plan Note (Signed)
I will start with ultrasound and baseline labs, PAP Smear to be obtained Urine GC sent today

## 2012-02-11 NOTE — Assessment & Plan Note (Signed)
Continue with psych, obtain labs

## 2012-02-11 NOTE — Progress Notes (Signed)
  Subjective:    Patient ID: Diana Ho, female    DOB: 1981-05-28, 31 y.o.   MRN: [id number]  HPI  Pt here to establish care. Previous PCP [facility] She is currently in the care of her father and step mother. DSS is involved heavily in care Her step mother has many concerns regarding her health. She was under the care of her biological mother- she was not supervised and exposed to many high risk behaviors, including substance abuse and sexual activiity. ? History of STD She is unable to read, her mathmatic skills are poor, She often lies about many things including making up symptoms about her health  Amenorrhea- she has difficulty with her period on and off, she was placed on OCP by the health dept and she had a cycle for the first few months, however past 4 months no period with neg pregnancy testing. She has been off the birth control for the past few months. No recent sexual activity  Back pain- she is being seen by a chiropracter- Ladona Ridgel here in town, told she has scoliosis but needs imaging,she is given tylenol and ibuprofen as needed  Bipolar- she followed by Mental Health Dr. Betti Cruz, needs labs done  H/O Osteoporosis in her chart, Step mother states she has not had a Dexa scan that she knows of  Snoring and choking- at night snores very loud and coughs a lot, sounds like she is choking, she has not witness any apneic episodes that she is aware of. She also coughs after eating mostly with liquids, she complains of some heartburn  PAP Smear done  Review of Systems    GEN- denies fatigue, fever, weight loss,weakness, recent illness HEENT- denies eye drainage, change in vision, nasal discharge, CVS- denies chest pain, palpitations RESP- denies SOB, cough, wheeze ABD- denies N/V, change in stools, abd pain GU- denies dysuria, hematuria, dribbling, incontinence MSK- denies joint pain, muscle aches, injury Neuro- denies headache, dizziness, syncope, seizure activity      Objective:    Physical Exam GEN- NAD, alert and oriented x3, pleasant HEENT- PERRL, EOMI, non injected sclera, pink conjunctiva, MMM, oropharynx clear Neck- Supple, no thyromegaly CVS- RRR, no murmur RESP-CTAB ABD-NABS,soft, NT,ND Spine- non tender, mild kyphosis in back EXT- No edema Pulses- Radial, DP- 2+ Psych- not depressed or overly anxious, no apparent hallucinations       Assessment & Plan:  /

## 2012-02-13 ENCOUNTER — Telehealth: Payer: Self-pay | Admitting: Family Medicine

## 2012-02-14 NOTE — Telephone Encounter (Signed)
Step mother is aware of the appointment

## 2012-02-16 ENCOUNTER — Ambulatory Visit (HOSPITAL_COMMUNITY): Payer: Medicaid Other

## 2012-02-16 ENCOUNTER — Other Ambulatory Visit: Payer: Self-pay | Admitting: Family Medicine

## 2012-02-16 ENCOUNTER — Ambulatory Visit (HOSPITAL_COMMUNITY)
Admission: RE | Admit: 2012-02-16 | Discharge: 2012-02-16 | Disposition: A | Discharge status: Home / Self Care | Payer: Medicaid Other | Source: Ambulatory Visit | Attending: Family Medicine | Admitting: Family Medicine

## 2012-02-16 DIAGNOSIS — N912 Amenorrhea, unspecified: Secondary | ICD-10-CM

## 2012-02-16 DIAGNOSIS — M549 Dorsalgia, unspecified: Secondary | ICD-10-CM

## 2012-02-28 ENCOUNTER — Ambulatory Visit (INDEPENDENT_AMBULATORY_CARE_PROVIDER_SITE_OTHER): Payer: Medicaid Other | Admitting: Family Medicine

## 2012-02-28 ENCOUNTER — Encounter: Payer: Self-pay | Admitting: Family Medicine

## 2012-02-28 VITALS — BP 104/72 | HR 102 | Resp 15 | Ht 64.0 in | Wt 154.0 lb

## 2012-02-28 DIAGNOSIS — N912 Amenorrhea, unspecified: Secondary | ICD-10-CM

## 2012-02-28 DIAGNOSIS — R1310 Dysphagia, unspecified: Secondary | ICD-10-CM

## 2012-02-28 DIAGNOSIS — M7989 Other specified soft tissue disorders: Secondary | ICD-10-CM

## 2012-02-28 NOTE — Assessment & Plan Note (Signed)
Concern for PCOS, TSH, FSH,LH normal, ultrasound shows multiple small cystic lesions on ovaries however count does not meet PCOS criteria outright.Step mother would like for her to be on some form of birth control. Will refer to GYN.

## 2012-02-28 NOTE — Assessment & Plan Note (Addendum)
Unclear cause, no appreciable problem today in hands or face. Discussed low dose prn diuretic if painful but episodes short lived and pt has low normotensive BP, therefore will hold

## 2012-02-28 NOTE — Assessment & Plan Note (Signed)
Continue H2 blocker, refer to GI

## 2012-02-28 NOTE — Patient Instructions (Signed)
I will send a referral to Dr. Emelda Fear [facility]) for her periods I will refer you to Gastroenterology,  Labs look good If she has a lot of hand swelling that stays call for an appt Continue the ranitidine  F/U 4 Months

## 2012-02-28 NOTE — Progress Notes (Signed)
  Subjective:    Patient ID: Diana Ho, female    DOB: [birth year], 31 y.o.   MRN: [id number]  HPI   Pt here to f/u labs and Imaging  Concerned about hand and face swelling that occurs on and off. It occurs randomly and resolves fairly quickly, pt is not aware if any particular causes such as being out in the heat or change in lotion/soap etc. Last night they had to cut a ring off her hand because of the swelling and tingling in her fingers. These episodes occur randomly.  Dysphagia- heartburn symptoms and cough improved with H2 blocker, pt still has some choking episodes mainly with liquids.   Review of Systems  GEN- denies fatigue, fever, weight loss,weakness, recent illness HEENT- denies eye drainage, change in vision, nasal discharge, CVS- denies chest pain, palpitations RESP- denies SOB, cough, wheeze ABD- denies N/V, change in stools, abd painy Neuro- denies headache, dizziness, syncope, seizure activity       Objective:   Physical Exam GEN- NAD, alert and oriented x3 HEENT- PERRL, EOMI, non injected sclera, pink conjunctiva, MMM, oropharynx clear, rosy hue to face Neck- Supple,  CVS- RRR, no murmur RESP-CTAB EXT- No edema in legs, ring indentation on right 2nd digit, no appreciable swelling Pulses- Radial, DP- 2+ Neuro- sensation grossly intact, normal monofilament on hands       Assessment & Plan:

## 2012-03-04 ENCOUNTER — Ambulatory Visit: Payer: Medicaid Other | Admitting: Family Medicine

## 2012-03-06 ENCOUNTER — Ambulatory Visit (INDEPENDENT_AMBULATORY_CARE_PROVIDER_SITE_OTHER): Payer: Medicaid Other | Admitting: Urgent Care

## 2012-03-06 ENCOUNTER — Encounter: Payer: Self-pay | Admitting: Urgent Care

## 2012-03-06 VITALS — BP 101/66 | HR 75 | Temp 97.8°F | Ht 63.0 in | Wt 156.4 lb

## 2012-03-06 DIAGNOSIS — K219 Gastro-esophageal reflux disease without esophagitis: Secondary | ICD-10-CM | POA: Insufficient documentation

## 2012-03-06 DIAGNOSIS — N912 Amenorrhea, unspecified: Secondary | ICD-10-CM

## 2012-03-06 DIAGNOSIS — R1310 Dysphagia, unspecified: Secondary | ICD-10-CM

## 2012-03-06 MED ORDER — OMEPRAZOLE 20 MG PO CPDR: DELAYED_RELEASE_CAPSULE | ORAL | Status: DC

## 2012-03-06 NOTE — Assessment & Plan Note (Signed)
Diana Ho is a pleasant 31 y.o. female with history of autism. She resides with her step-mother who presents with her today. She has had symptoms of GERD, with nocturnal acid reflux and dysphagia with choking spells.  She has been started on ranitidine and had some relief, but not complete relief, especially with nocturnal symptoms.  She may have an element of oral pharyngeal dysphagia as most of her symptoms occur with liquids when she drinks fast. She will need EGD to rule out complicated GERD, erosive esophagitis, esophageal web, ring, or stricture.  I have discussed risks & benefits which include, but are not limited to, bleeding, infection, perforation & drug reaction.  The patient agrees with this plan & written consent will be obtained.  Procedure will need to be done with deep sedation (propofol) in the OR under the direction of anesthesia services for multiple psychoactive medications.  GERD literature Stop ranitidine Begin omeprazole 20 mg 30 minutes before breakfast Urine pregnancy prior to EGD

## 2012-03-06 NOTE — Patient Instructions (Addendum)
Stop ranitidine Begin omeprazole 20 mg daily 30 minutes before breakfast You will need an EGD (upper endoscopy) and possible dilation of your esophagus with Dr. Darrick Penna. Please get a urine pregnancy test done at the lab prior to your procedure Gastroesophageal Reflux Disease, Adult Gastroesophageal reflux disease (GERD) happens when acid from your stomach flows up into the esophagus. When acid comes in contact with the esophagus, the acid causes soreness (inflammation) in the esophagus. Over time, GERD may create small holes (ulcers) in the lining of the esophagus. CAUSES   Increased body weight. This puts pressure on the stomach, making acid rise from the stomach into the esophagus.   Smoking. This increases acid production in the stomach.   Drinking alcohol. This causes decreased pressure in the lower esophageal sphincter (valve or ring of muscle between the esophagus and stomach), allowing acid from the stomach into the esophagus.   Late evening meals and a full stomach. This increases pressure and acid production in the stomach.   A malformed lower esophageal sphincter.  Sometimes, no cause is found. SYMPTOMS   Burning pain in the lower part of the mid-chest behind the breastbone and in the mid-stomach area. This may occur twice a week or more often.   Trouble swallowing.   Sore throat.   Dry cough.   Asthma-like symptoms including chest tightness, shortness of breath, or wheezing.  DIAGNOSIS  Your caregiver may be able to diagnose GERD based on your symptoms. In some cases, X-rays and other tests may be done to check for complications or to check the condition of your stomach and esophagus. TREATMENT  Your caregiver may recommend over-the-counter or prescription medicines to help decrease acid production. Ask your caregiver before starting or adding any new medicines.  HOME CARE INSTRUCTIONS   Change the factors that you can control. Ask your caregiver for guidance concerning  weight loss, quitting smoking, and alcohol consumption.   Avoid foods and drinks that make your symptoms worse, such as:   Caffeine or alcoholic drinks.   Chocolate.   Peppermint or mint flavorings.   Garlic and onions.   Spicy foods.   Citrus fruits, such as oranges, lemons, or limes.   Tomato-based foods such as sauce, chili, salsa, and pizza.   Fried and fatty foods.   Avoid lying down for the 3 hours prior to your bedtime or prior to taking a nap.   Eat small, frequent meals instead of large meals.   Wear loose-fitting clothing. Do not wear anything tight around your waist that causes pressure on your stomach.   Raise the head of your bed 6 to 8 inches with wood blocks to help you sleep. Extra pillows will not help.   Only take over-the-counter or prescription medicines for pain, discomfort, or fever as directed by your caregiver.   Do not take aspirin, ibuprofen, or other nonsteroidal anti-inflammatory drugs (NSAIDs).  SEEK IMMEDIATE MEDICAL CARE IF:   You have pain in your arms, neck, jaw, teeth, or back.   Your pain increases or changes in intensity or duration.   You develop nausea, vomiting, or sweating (diaphoresis).   You develop shortness of breath, or you faint.   Your vomit is green, yellow, black, or looks like coffee grounds or blood.   Your stool is red, bloody, or black.  These symptoms could be signs of other problems, such as heart disease, gastric bleeding, or esophageal bleeding. MAKE SURE YOU:   Understand these instructions.   Will watch your condition.  Will get help right away if you are not doing well or get worse.  Document Released: 08/30/2005 Document Revised: 11/09/2011 Document Reviewed: 06/09/2011 [facility] Patient Information 2012 [place], [place].

## 2012-03-06 NOTE — Progress Notes (Signed)
Referring Provider: Salley Scarlet, MD Primary Care Physician:  Milinda Antis, MD, MD Primary Gastroenterologist:  Dr. Darrick Penna  Chief Complaint  Patient presents with  . Dysphagia  . Gastrophageal Reflux   HPI:  Diana Ho is a 31 y.o. female here as a referral from Dr. Jeanice Lim for dysphagia and GERD.  She presents with her stepmother who gives most of the history today.  She tells me she awakens choking at night when she sleeps.  C/o heartburn couple times per week. Worse w/ large meals.  Pt eats very fast & too much per step-mother due to her autism.  She has been on ranitidine for 3 weeks now & this has helped, but she is still awakening at night with symptoms.  C/o choking when she drinks too fast.  No problems with her large pills.  No probems w/ solids. Denies odynophagia. C/o lower abdominal pain & has appt at [facility] regarding ovarian cysts bilaterally.  Weight up 40# in past 2 yrs since she came to live w/ step-mother.   The mother states she was previously malnourished when she lived with her mother.  Denies any lower GI symptoms including constipation, diarrhea, rectal bleeding, melena or weight loss. Past Medical History  Diagnosis Date  . Bipolar 1 disorder   . Renal disorder   . Autistic disorder   . Schizophrenia   . Cervical atypia   . Kidney stones    Past Surgical History  Procedure Date  . Foot surgery    Current Outpatient Prescriptions  Medication Sig Dispense Refill  . albuterol (PROVENTIL HFA;VENTOLIN HFA) 108 (90 BASE) MCG/ACT inhaler Inhale 2 puffs into the lungs every 4 (four) hours as needed for wheezing.  1 Inhaler  0  . ARIPiprazole (ABILIFY) 15 MG tablet Take 15 mg by mouth daily.        . calcium carbonate (OS-CAL) 600 MG TABS Take 600 mg by mouth daily.        . clonazePAM (KLONOPIN) 0.5 MG tablet Take 0.5 mg by mouth at bedtime.        . divalproex (DEPAKOTE ER) 250 MG 24 hr tablet Take 250 mg by mouth daily.        . ranitidine (ZANTAC) 150  MG tablet Take 1 tablet (150 mg total) by mouth 2 (two) times daily.  60 tablet  1  . omeprazole (PRILOSEC) 20 MG capsule 1 by mouth daily 30 minutes before breakfast  31 capsule  5   Allergies as of 03/06/2012  . (No Known Allergies)   Family History:There is no known family history of colorectal carcinoma or inflammatory bowel disease.  Problem Relation Age of Onset  . Diabetes Mother   . COPD Mother   . Liver cancer Maternal Grandfather     etoh  . Hypertension Mother    History   Social History  . Marital Status: Single    Spouse Name: N/A    Number of Children: 0  . Years of Education: N/A   Occupational History  . disabled    Social History Main Topics  . Smoking status: Former Games developer  . Smokeless tobacco: Never Used  . Alcohol Use: No     previous drinker  . Drug Use: No  . Sexually Active: Yes     multiple partners in the past   Other Topics Concern  . Not on file   Social History Narrative   Lives w/ father & step-mother  Review of Systems: Gen: Denies any  fever, chills, sweats, anorexia, fatigue, weakness, malaise, weight loss, and sleep disorder CV: Denies chest pain, angina, palpitations, syncope, orthopnea, PND, peripheral edema, and claudication. Resp: Denies dyspnea at rest, dyspnea with exercise, cough, sputum, wheezing, coughing up blood, and pleurisy. GI: Denies vomiting blood, jaundice, and fecal incontinence.   GU : Denies urinary burning, blood in urine, urinary frequency, urinary hesitancy, nocturnal urination, and urinary incontinence. MS: Denies joint pain, limitation of movement, and swelling, stiffness, low back pain, extremity pain. Denies muscle weakness, cramps, atrophy.  Derm: Denies rash, itching, dry skin, hives, moles, warts, or unhealing ulcers.  Psych: Denies depression, anxiety, memory loss, suicidal ideation, hallucinations, paranoia, and confusion. Heme: Denies bruising, bleeding, and enlarged lymph nodes. Neuro:  Denies any  headaches, dizziness, paresthesias. Endo:  Denies any problems with DM, thyroid, adrenal function.  Physical Exam: BP 101/66  Pulse 75  Temp(Src) 97.8 F (36.6 C) (Temporal)  Ht 5\' 3"  (1.6 m)  Wt 156 lb 6.4 oz (70.943 kg)  BMI 27.71 kg/m2  LMP 10/12/2011 General:   Alert,  Well-developed, well-nourished, pleasant and cooperative in NAD.  However her stepmother. Head:  Normocephalic and atraumatic. Eyes:  Sclera clear, no icterus.   Conjunctiva pink. Ears:  Normal auditory acuity. Nose:  No deformity, discharge, or lesions. Mouth:  No deformity or lesions,oropharynx pink & moist. Neck:  Supple; no masses or thyromegaly. Lungs:  Clear throughout to auscultation.   No wheezes, crackles, or rhonchi. No acute distress. Heart:  Regular rate and rhythm; no murmurs, clicks, rubs,  or gallops. Abdomen:  Normal bowel sounds.  No bruits.  Soft, non-tender and non-distended without masses, hepatosplenomegaly or hernias noted.  No guarding or rebound tenderness.   Rectal:  Deferred. Msk:  Symmetrical without gross deformities. Normal posture. Pulses:  Normal pulses noted. Extremities:  No clubbing or edema. Neurologic:  Alert and oriented x4;  grossly normal neurologically. Skin:  Intact without significant lesions or rashes. Lymph Nodes:  No significant cervical adenopathy. Psych:  Alert and cooperative. Normal mood and affect.

## 2012-03-06 NOTE — Progress Notes (Signed)
Faxed to PCP

## 2012-03-13 ENCOUNTER — Encounter (HOSPITAL_COMMUNITY): Payer: Self-pay

## 2012-03-13 ENCOUNTER — Encounter (HOSPITAL_COMMUNITY)
Admission: RE | Admit: 2012-03-13 | Discharge: 2012-03-13 | Disposition: A | Discharge status: Home / Self Care | Payer: Medicaid Other | Source: Ambulatory Visit | Attending: Gastroenterology | Admitting: Gastroenterology

## 2012-03-13 HISTORY — DX: Gastro-esophageal reflux disease without esophagitis: K21.9

## 2012-03-13 LAB — HEMOGLOBIN AND HEMATOCRIT, BLOOD
HCT: 41.2 % (ref 36.0–46.0)
Hemoglobin: 13.7 g/dL (ref 12.0–15.0)

## 2012-03-13 LAB — BASIC METABOLIC PANEL
Calcium: 9.4 mg/dL (ref 8.4–10.5)
Creatinine, Ser: 0.87 mg/dL (ref 0.50–1.10)
GFR calc Af Amer: >90 mL/min (ref >90)

## 2012-03-13 LAB — PREGNANCY, URINE: Preg Test, Ur: NEGATIVE

## 2012-03-13 LAB — HCG, SERUM, QUALITATIVE: Preg, Serum: NEGATIVE

## 2012-03-13 NOTE — Patient Instructions (Addendum)
21 Diana Ho  03/13/2012   Your procedure is scheduled on:  03/19/2012  Report to [facility] at  815  AM.  Call this number if you have problems the morning of surgery: [phone]   Remember:   Do not eat food:After Midnight.  May have clear liquids:until Midnight .  Clear liquids include soda, tea, black coffee, apple or grape juice, broth.  Take these medicines the morning of surgery with A SIP OF WATER: abilify,prilosec,zantac,klonopin,depakote   Do not wear jewelry, make-up or nail polish.  Do not wear lotions, powders, or perfumes. You may wear deodorant.  Do not shave 48 hours prior to surgery.  Do not bring valuables to the hospital.  Contacts, dentures or bridgework may not be worn into surgery.  Leave suitcase in the car. After surgery it may be brought to your room.  For patients admitted to the hospital, checkout time is 11:00 AM the day of discharge.   Patients discharged the day of surgery will not be allowed to drive home.  Name and phone number of your driver: mom  Special Instructions: N/A   Please read over the following fact sheets that you were given: Pain Booklet, Surgical Site Infection Prevention, Anesthesia Post-op Instructions and Care and Recovery After Surgery Esophagogastroduodenoscopy This is an endoscopic procedure (a procedure that uses a device like a flexible telescope) that allows your caregiver to view the upper stomach and small bowel. This test allows your caregiver to look at the esophagus. The esophagus carries food from your mouth to your stomach. They can also look at your duodenum. This is the first part of the small intestine that attaches to the stomach. This test is used to detect problems in the bowel such as ulcers and inflammation. PREPARATION FOR TEST Nothing to eat after midnight the day before the test. NORMAL FINDINGS Normal esophagus, stomach, and duodenum. Ranges for normal findings may vary among different laboratories and  hospitals. You should always check with your doctor after having lab work or other tests done to discuss the meaning of your test results and whether your values are considered within normal limits. MEANING OF TEST  Your caregiver will go over the test results with you and discuss the importance and meaning of your results, as well as treatment options and the need for additional tests if necessary. OBTAINING THE TEST RESULTS It is your responsibility to obtain your test results. Ask the lab or department performing the test when and how you will get your results. Document Released: 03/23/2005 Document Revised: 11/09/2011 Document Reviewed: 10/30/2008 [facility] Patient Information 2012 [place], [place].PATIENT INSTRUCTIONS POST-ANESTHESIA  IMMEDIATELY FOLLOWING SURGERY:  Do not drive or operate machinery for the first twenty four hours after surgery.  Do not make any important decisions for twenty four hours after surgery or while taking narcotic pain medications or sedatives.  If you develop intractable nausea and vomiting or a severe headache please notify your doctor immediately.  FOLLOW-UP:  Please make an appointment with your surgeon as instructed. You do not need to follow up with anesthesia unless specifically instructed to do so.  WOUND CARE INSTRUCTIONS (if applicable):  Keep a dry clean dressing on the anesthesia/puncture wound site if there is drainage.  Once the wound has quit draining you may leave it open to air.  Generally you should leave the bandage intact for twenty four hours unless there is drainage.  If the epidural site drains for more than 36-48 hours please call the anesthesia department.  QUESTIONS?:  Please feel free to call your physician or the hospital operator if you have any questions, and they will be happy to assist you.     [facility] Anesthesia Department [address] [place] [phone]

## 2012-03-13 NOTE — Progress Notes (Signed)
Cc to Dr. Jeanice Lim

## 2012-03-13 NOTE — Progress Notes (Signed)
Quick Note:  Pt's mom informed. ______

## 2012-03-13 NOTE — Progress Notes (Signed)
Quick Note:  Please let patient know test results. CC: Milinda Antis, MD Thanks  ______

## 2012-03-13 NOTE — Progress Notes (Signed)
Quick Note:  Routing to Leigh Ann. ______ 

## 2012-03-18 ENCOUNTER — Encounter (HOSPITAL_COMMUNITY): Payer: Self-pay | Admitting: Pharmacy Technician

## 2012-03-19 ENCOUNTER — Ambulatory Visit (HOSPITAL_COMMUNITY): Payer: Medicaid Other | Admitting: Anesthesiology

## 2012-03-19 ENCOUNTER — Encounter (HOSPITAL_COMMUNITY): Payer: Self-pay | Admitting: *Deleted

## 2012-03-19 ENCOUNTER — Encounter (HOSPITAL_COMMUNITY)
Admission: RE | Disposition: A | Discharge status: Home / Self Care | Payer: Self-pay | Source: Ambulatory Visit | Attending: Gastroenterology

## 2012-03-19 ENCOUNTER — Ambulatory Visit (HOSPITAL_COMMUNITY)
Admission: RE | Admit: 2012-03-19 | Discharge: 2012-03-19 | Disposition: A | Discharge status: Home / Self Care | Payer: Medicaid Other | Source: Ambulatory Visit | Attending: Gastroenterology | Admitting: Gastroenterology

## 2012-03-19 ENCOUNTER — Encounter (HOSPITAL_COMMUNITY): Payer: Self-pay | Admitting: Anesthesiology

## 2012-03-19 DIAGNOSIS — Z79899 Other long term (current) drug therapy: Secondary | ICD-10-CM | POA: Insufficient documentation

## 2012-03-19 DIAGNOSIS — R1310 Dysphagia, unspecified: Secondary | ICD-10-CM | POA: Insufficient documentation

## 2012-03-19 DIAGNOSIS — Z01812 Encounter for preprocedural laboratory examination: Secondary | ICD-10-CM | POA: Insufficient documentation

## 2012-03-19 DIAGNOSIS — R1013 Epigastric pain: Secondary | ICD-10-CM | POA: Insufficient documentation

## 2012-03-19 DIAGNOSIS — Z01810 Encounter for preprocedural cardiovascular examination: Secondary | ICD-10-CM | POA: Insufficient documentation

## 2012-03-19 DIAGNOSIS — K2940 Chronic atrophic gastritis without bleeding: Secondary | ICD-10-CM | POA: Insufficient documentation

## 2012-03-19 DIAGNOSIS — K2970 Gastritis, unspecified, without bleeding: Secondary | ICD-10-CM

## 2012-03-19 DIAGNOSIS — K3189 Other diseases of stomach and duodenum: Secondary | ICD-10-CM | POA: Insufficient documentation

## 2012-03-19 DIAGNOSIS — K2990 Gastroduodenitis, unspecified, without bleeding: Secondary | ICD-10-CM

## 2012-03-19 HISTORY — PX: SAVORY DILATION: SHX5439

## 2012-03-19 HISTORY — PX: BIOPSY: SHX5522

## 2012-03-19 SURGERY — ESOPHAGOGASTRODUODENOSCOPY (EGD) WITH PROPOFOL
Anesthesia: Monitor Anesthesia Care

## 2012-03-19 MED ORDER — MIDAZOLAM HCL 2 MG/2ML IJ SOLN
INTRAMUSCULAR | Status: AC
  Administered 2012-03-19: 2 mg via INTRAVENOUS
  Filled 2012-03-19: qty 2

## 2012-03-19 MED ORDER — SIMETHICONE 40 MG/0.6ML PO SUSP
ORAL | Status: DC | PRN
  Administered 2012-03-19: 11:00:00

## 2012-03-19 MED ORDER — GLYCOPYRROLATE 0.2 MG/ML IJ SOLN
0.2000 mg | Freq: Once | INTRAMUSCULAR | Status: AC
  Administered 2012-03-19: 0.2 mg via INTRAVENOUS

## 2012-03-19 MED ORDER — LACTATED RINGERS IV SOLN
INTRAVENOUS | Status: DC
  Administered 2012-03-19: 1000 mL via INTRAVENOUS

## 2012-03-19 MED ORDER — MINERAL OIL PO OIL
TOPICAL_OIL | ORAL | Status: AC
  Filled 2012-03-19: qty 30

## 2012-03-19 MED ORDER — PROPOFOL 10 MG/ML IV EMUL
INTRAVENOUS | Status: DC | PRN
  Administered 2012-03-19: 100 ug/kg/min via INTRAVENOUS

## 2012-03-19 MED ORDER — MIDAZOLAM HCL 2 MG/2ML IJ SOLN
INTRAMUSCULAR | Status: AC
  Administered 2012-03-19: 2 mg via INTRAVENOUS
  Filled 2012-03-19: qty 4

## 2012-03-19 MED ORDER — MIDAZOLAM HCL 2 MG/2ML IJ SOLN
1.0000 mg | INTRAMUSCULAR | Status: AC | PRN
  Administered 2012-03-19 (×3): 2 mg via INTRAVENOUS

## 2012-03-19 MED ORDER — ONDANSETRON HCL 4 MG/2ML IJ SOLN: 4.0000 mg | Freq: Once | INTRAMUSCULAR | Status: DC | PRN

## 2012-03-19 MED ORDER — PROPOFOL 10 MG/ML IV BOLUS
INTRAVENOUS | Status: DC | PRN
  Administered 2012-03-19 (×2): 20 mg via INTRAVENOUS

## 2012-03-19 MED ORDER — GLYCOPYRROLATE 0.2 MG/ML IJ SOLN
INTRAMUSCULAR | Status: AC
  Administered 2012-03-19: 0.2 mg via INTRAVENOUS
  Filled 2012-03-19: qty 1

## 2012-03-19 MED ORDER — MIDAZOLAM HCL 5 MG/5ML IJ SOLN
INTRAMUSCULAR | Status: DC | PRN
  Administered 2012-03-19: 2 mg via INTRAVENOUS

## 2012-03-19 MED ORDER — BUTAMBEN-TETRACAINE-BENZOCAINE 2-2-14 % EX AERO
1.0000 | INHALATION_SPRAY | Freq: Once | CUTANEOUS | Status: AC
  Administered 2012-03-19: 1 via TOPICAL
  Filled 2012-03-19: qty 56

## 2012-03-19 MED ORDER — ONDANSETRON HCL 4 MG/2ML IJ SOLN
INTRAMUSCULAR | Status: AC
  Administered 2012-03-19: 4 mg via INTRAVENOUS
  Filled 2012-03-19: qty 2

## 2012-03-19 MED ORDER — ONDANSETRON HCL 4 MG/2ML IJ SOLN
4.0000 mg | Freq: Once | INTRAMUSCULAR | Status: AC
  Administered 2012-03-19: 4 mg via INTRAVENOUS

## 2012-03-19 MED ORDER — FENTANYL CITRATE 0.05 MG/ML IJ SOLN: 25.0000 ug | INTRAMUSCULAR | Status: DC | PRN

## 2012-03-19 SURGICAL SUPPLY — 16 items
BLOCK BITE 60FR ADLT L/F BLUE (MISCELLANEOUS) ×2 IMPLANT
ELECT REM PT RETURN 9FT ADLT (ELECTROSURGICAL)
ELECTRODE REM PT RTRN 9FT ADLT (ELECTROSURGICAL) IMPLANT
FLOOR PAD 36X40 (MISCELLANEOUS) ×2
FORCEP RJ3 GP 1.8X160 W-NEEDLE (CUTTING FORCEPS) IMPLANT
FORCEPS BIOP RAD 4 LRG CAP 4 (CUTTING FORCEPS) ×2 IMPLANT
NEEDLE SCLEROTHERAPY 25GX240 (NEEDLE) IMPLANT
PAD FLOOR 36X40 (MISCELLANEOUS) ×1 IMPLANT
PROBE APC STR FIRE (PROBE) IMPLANT
PROBE INJECTION GOLD (MISCELLANEOUS)
PROBE INJECTION GOLD 7FR (MISCELLANEOUS) IMPLANT
SNARE SHORT THROW 13M SML OVAL (MISCELLANEOUS) IMPLANT
SYR 50ML LL SCALE MARK (SYRINGE) ×2 IMPLANT
TUBING ENDO SMARTCAP PENTAX (MISCELLANEOUS) ×4 IMPLANT
TUBING IRRIGATION ENDOGATOR (MISCELLANEOUS) ×2 IMPLANT
WATER STERILE IRR 1000ML POUR (IV SOLUTION) ×2 IMPLANT

## 2012-03-19 NOTE — H&P (Signed)
  Primary Care Physician:  Milinda Antis, MD, MD Primary Gastroenterologist:  Dr. Darrick Penna  Pre-Procedure History & Physical: HPI:  Diana Ho is a 31 y.o. female here for GERD/DYSPHAGIA.  Past Medical History  Diagnosis Date  . Bipolar 1 disorder   . Renal disorder   . Autistic disorder   . Schizophrenia   . Cervical atypia   . Kidney stones   . Asthma   . GERD (gastroesophageal reflux disease)     Past Surgical History  Procedure Date  . Foot surgery     as baby  . Kidney stone surgery     Prior to Admission medications   Medication Sig Start Date End Date Taking? Authorizing Provider  ARIPiprazole (ABILIFY) 15 MG tablet Take 15 mg by mouth at bedtime.     Historical Provider, MD  calcium carbonate (OS-CAL) 600 MG TABS Take 600 mg by mouth daily.      Historical Provider, MD  clonazePAM (KLONOPIN) 0.5 MG tablet Take 0.5 mg by mouth 3 (three) times daily as needed. For nerves    Historical Provider, MD  divalproex (DEPAKOTE ER) 250 MG 24 hr tablet Take 750 mg by mouth at bedtime.     Historical Provider, MD  medroxyPROGESTERone (PROVERA) 10 MG tablet Take 10 mg by mouth every morning.    Historical Provider, MD  omeprazole (PRILOSEC) 20 MG capsule 1 by mouth daily 30 minutes before breakfast 03/06/12   Joselyn Arrow, NP    Allergies as of 03/06/2012  . (No Known Allergies)    Family History  Problem Relation Age of Onset  . Diabetes Mother   . COPD Mother   . Hypertension Mother   . Liver cancer Maternal Grandfather     etoh  . Anesthesia problems Neg Hx   . Hypotension Neg Hx   . Malignant hyperthermia Neg Hx   . Pseudochol deficiency Neg Hx     History   Social History  . Marital Status: Single    Spouse Name: N/A    Number of Children: 0  . Years of Education: N/A   Occupational History  . disabled    Social History Main Topics  . Smoking status: Former Smoker    Quit date: 03/13/2009  . Smokeless tobacco: Never Used  . Alcohol Use: No   previous drinker  . Drug Use: No  . Sexually Active: Yes     multiple partners in the past   Other Topics Concern  . Not on file   Social History Narrative   Lives w/ father & step-mother    Review of Systems: See HPI, otherwise negative ROS   Physical Exam: LMP 10/20/2011 General:   Alert,  pleasant and cooperative in NAD Head:  Normocephalic and atraumatic. Neck:  Supple;  Lungs:  Clear throughout to auscultation.    Heart:  Regular rate and rhythm. Abdomen:  Soft, nontender and nondistended. Normal bowel sounds, without guarding, and without rebound.   Neurologic:  Alert and  oriented x4;  grossly normal neurologically.  Impression/Plan:    DYSPHAGIA/GERD  PLAN:  EGD/?DIL TODAY

## 2012-03-19 NOTE — Progress Notes (Signed)
Awake. Swallowing without difficulty. 

## 2012-03-19 NOTE — Discharge Instructions (Signed)
I dilated your esophagus due to your difficulty swallowing. You have gastritis. I biopsied your esophagus & stomach.   CONTINUE omeprazole. Your biopsies will be back within 7 days. FOLLOW UP IN 6 weeks. UPPER ENDOSCOPY AFTER CARE Read the instructions outlined below and refer to this sheet in the next week. These discharge instructions provide you with general information on caring for yourself after you leave the hospital. While your treatment has been planned according to the most current medical practices available, unavoidable complications occasionally occur. If you have any problems or questions after discharge, call DR. Nusaiba Guallpa, [phone].  ACTIVITY  You may resume your regular activity, but move at a slower pace for the next 24 hours.   Take frequent rest periods for the next 24 hours.   Walking will help get rid of the air and reduce the bloated feeling in your belly (abdomen).   No driving for 24 hours (because of the medicine (anesthesia) used during the test).   You may shower.   Do not sign any important legal documents or operate any machinery for 24 hours (because of the anesthesia used during the test).    NUTRITION  Drink plenty of fluids.   You may resume your normal diet as instructed by your doctor.   Begin with a light meal and progress to your normal diet. Heavy or fried foods are harder to digest and may make you feel sick to your stomach (nauseated).   Avoid alcoholic beverages for 24 hours or as instructed.    MEDICATIONS  You may resume your normal medications.   WHAT YOU CAN EXPECT TODAY  Some feelings of bloating in the abdomen.   Passage of more gas than usual.    IF YOU HAD A BIOPSY TAKEN DURING THE UPPER ENDOSCOPY:  Eat a soft diet IF YOU HAVE NAUSEA, BLOATING, ABDOMINAL PAIN, OR VOMITING.    FINDING OUT THE RESULTS OF YOUR TEST Not all test results are available during your visit. DR. Darrick Penna WILL CALL YOU WITHIN 7 DAYS OF YOUR  PROCEDUE WITH YOUR RESULTS. Do not assume everything is normal if you have not heard from DR. Debarah Mccumbers IN ONE WEEK, CALL HER OFFICE AT [phone].  SEEK IMMEDIATE MEDICAL ATTENTION AND CALL THE OFFICE: [phone] IF:  You have more than a spotting of blood in your stool.   Your belly is swollen (abdominal distention).   You are nauseated or vomiting.   You have a temperature over 101F.   You have abdominal pain or discomfort that is severe or gets worse throughout the day.  Gastritis  Gastritis is an inflammation (the body's way of reacting to injury and/or infection) of the stomach. It is often caused by viral or bacterial (germ) infections. It can also be caused BY ASPIRIN, BC/GOODY POWDER'S, (IBUPROFEN) MOTRIN, OR ALEVE (NAPROXEN), chemicals (including alcohol), SPICY FOODS, and medications. This illness may be associated with generalized malaise (feeling tired, not well), UPPER ABDOMINAL STOMACH cramps, and fever. One common bacterial cause of gastritis is an organism known as H. Pylori. This can be treated with antibiotics.

## 2012-03-19 NOTE — Progress Notes (Signed)
REVIEWED.  

## 2012-03-19 NOTE — Anesthesia Preprocedure Evaluation (Signed)
Anesthesia Evaluation  Patient identified by MRN, date of birth, ID band Patient awake    Reviewed: Allergy & Precautions, H&P , NPO status , Patient's Chart, lab work & pertinent test results  Airway Mallampati: II      Dental  (+) Teeth Intact   Pulmonary asthma (inhaler this AM) ,    Pulmonary exam normal       Cardiovascular negative cardio ROS  Rhythm:Regular     Neuro/Psych PSYCHIATRIC DISORDERS Bipolar Disorder Schizophrenia    GI/Hepatic GERD- (dysphagia)  Medicated,  Endo/Other    Renal/GU      Musculoskeletal   Abdominal   Peds  Hematology   Anesthesia Other Findings   Reproductive/Obstetrics                           Anesthesia Physical Anesthesia Plan  ASA: III  Anesthesia Plan: MAC   Post-op Pain Management:    Induction: Intravenous  Airway Management Planned: Simple Face Mask  Additional Equipment:   Intra-op Plan:   Post-operative Plan:   Informed Consent: I have reviewed the patients History and Physical, chart, labs and discussed the procedure including the risks, benefits and alternatives for the proposed anesthesia with the patient or authorized representative who has indicated his/her understanding and acceptance.     Plan Discussed with:   Anesthesia Plan Comments:         Anesthesia Quick Evaluation

## 2012-03-19 NOTE — Anesthesia Postprocedure Evaluation (Signed)
  Anesthesia Post-op Note  Patient: Diana Ho  Procedure(s) Performed: Procedure(s) (LRB): ESOPHAGOGASTRODUODENOSCOPY (EGD) WITH PROPOFOL (N/A) SAVORY DILATION (N/A)  Patient Location: PACU  Anesthesia Type: General  Level of Consciousness: awake, alert  and oriented  Airway and Oxygen Therapy: Patient Spontanous Breathing  Post-op Pain: none  Post-op Assessment: Post-op Vital signs reviewed, Patient's Cardiovascular Status Stable, Respiratory Function Stable, Patent Airway and No signs of Nausea or vomiting  Post-op Vital Signs: Reviewed and stable  Complications: No apparent anesthesia complications

## 2012-03-19 NOTE — Transfer of Care (Signed)
Immediate Anesthesia Transfer of Care Note  Patient: Diana Ho  Procedure(s) Performed: Procedure(s) (LRB): ESOPHAGOGASTRODUODENOSCOPY (EGD) WITH PROPOFOL (N/A) SAVORY DILATION (N/A)  Patient Location: PACU  Anesthesia Type: General  Level of Consciousness: awake, alert  and oriented  Airway & Oxygen Therapy: Patient Spontanous Breathing  Post-op Assessment: Report given to PACU RN  Post vital signs: Reviewed and stable  Complications: No apparent anesthesia complications

## 2012-03-20 NOTE — Op Note (Signed)
[facility] [address] [place], [place]  [address]  ENDOSCOPY PROCEDURE REPORT  PATIENT:  Diana Ho, Diana Ho  MR#:  [id number] BIRTHDATE:  04/20/1981, 31 yrs. old  GENDER:  female  ENDOSCOPIST:  Jonette Eva, MD ASSISTANT: Referred by:  Milinda Antis, M.D.  PROCEDURE DATE:  03/19/2012 PROCEDURE:  EGD with dilatation over guidewire, EGD with biopsy ASA CLASS: INDICATIONS:  DYSPHAGIA, REGURGUITATION, DYSPEPSIA  MEDICATIONS:   MAC sedation, administered by CRNA TOPICAL ANESTHETIC:  Cetacaine Spray  DESCRIPTION OF PROCEDURE:   After the risks benefits and alternatives of the procedure were thoroughly explained, informed consent was obtained.  The  endoscope was introduced through the mouth and advanced to the second portion of the duodenum.  The instrument was slowly withdrawn as the mucosa was carefully examined.  Prior to withdrawal of the scope, the guidwire was placed.  The esophagus was dilated successfully.  The patient was recovered in endoscopy and discharged home in satisfactory condition. <<PROCEDUREIMAGES>>  Moderate gastritis was found & BIOPSIED VIA COLD FORCEPS. NL APPARRING ESOPHAGUS-BIOPSIES OBTAINED 15 CM AND 30 CM FROMTHE TEETH. GE JXN AT 35 C. NL DUODENUM.   Dilation was then performed at the total esophagus  1) Dilator:  Savary over guidewire  Size(s):  12.8-16 MM Resistance:  minimal  Heme:  yes Appearance:  COMPLICATIONS:  None  ENDOSCOPIC IMPRESSION: DYSPHAGIA-DUE TO ?CERVICAL WEB V. ESO MOTILITY DISORDER GASTRITIS  RECOMMENDATIONS: OMP DAILY AWAIT BIOPSIES OPV IN 6 WEEKS. CONSIDER BASW IF SX NOT IMPROVED.  REPEAT EXAM:  No  ______________________________ Jonette Eva, MD  CC:  n. eSIGNED:   Allahna Husband at 03/20/2012 08:54 AM  Farrel Demark, [phone]

## 2012-03-21 ENCOUNTER — Encounter (HOSPITAL_COMMUNITY): Payer: Self-pay | Admitting: Gastroenterology

## 2012-03-27 ENCOUNTER — Telehealth: Payer: Self-pay | Admitting: Gastroenterology

## 2012-03-27 NOTE — Telephone Encounter (Signed)
LMOM to call.

## 2012-03-27 NOTE — Telephone Encounter (Signed)
Results forwarded to PCP

## 2012-03-27 NOTE — Telephone Encounter (Signed)
Please call pt. HER stomach Bx shows gastritis.   CONTINUE omeprazole. FOLLOW UP IN 6 weeks.

## 2012-03-28 NOTE — Telephone Encounter (Signed)
Called and informed pt's mom.  

## 2012-04-24 ENCOUNTER — Other Ambulatory Visit: Payer: Self-pay

## 2012-04-24 ENCOUNTER — Ambulatory Visit (INDEPENDENT_AMBULATORY_CARE_PROVIDER_SITE_OTHER): Payer: Medicaid Other | Admitting: Gastroenterology

## 2012-04-24 ENCOUNTER — Encounter: Payer: Self-pay | Admitting: Gastroenterology

## 2012-04-24 DIAGNOSIS — R1310 Dysphagia, unspecified: Secondary | ICD-10-CM

## 2012-04-24 NOTE — Patient Instructions (Signed)
COMPLETE THE MODIFIED BARIUM SWALLOW.  CONTINUE TAKING OMEPRAZOLE.  FOLLOW UP IN 4 MOS.

## 2012-04-24 NOTE — Telephone Encounter (Signed)
Pt and mother are aware of OV on 6/26 with SF in E30

## 2012-04-26 ENCOUNTER — Ambulatory Visit (HOSPITAL_COMMUNITY)
Admission: RE | Admit: 2012-04-26 | Discharge: 2012-04-26 | Disposition: A | Discharge status: Home / Self Care | Payer: Medicaid Other | Source: Ambulatory Visit | Attending: Gastroenterology | Admitting: Gastroenterology

## 2012-04-26 DIAGNOSIS — R1310 Dysphagia, unspecified: Secondary | ICD-10-CM | POA: Insufficient documentation

## 2012-04-26 DIAGNOSIS — K219 Gastro-esophageal reflux disease without esophagitis: Secondary | ICD-10-CM | POA: Insufficient documentation

## 2012-05-01 NOTE — Assessment & Plan Note (Signed)
CONTINUES.  COMPLETE THE MODIFIED BARIUM SWALLOW.  CONTINUE TAKING OMEPRAZOLE.  FOLLOW UP IN 4 MOS.

## 2012-05-01 NOTE — Progress Notes (Signed)
  Subjective:    Patient ID: Diana Ho, female    DOB: March 05, 1981, 31 y.o.   MRN: [id number]  PCP: [place]  HPI PT LAST SEEN APR 2013 C/O DYSPHAGIA/ EGD/DIL PERFORMED AND bX OF STOMACH AND ESOPHAGUS TAKEN. STOMACH bX SHOWED GASTRITIS AND ESO Bx ARE NL. STILL HAVING DIFFICULTY SWALLOWING. REGURGITATION IMPROVED. DYSPEPSIA IMPROVED. HX PER CARE GIVER.  Past Medical History  Diagnosis Date  . Bipolar 1 disorder   . Renal disorder   . Autistic disorder   . Schizophrenia   . Cervical atypia   . Kidney stones   . Asthma   . GERD (gastroesophageal reflux disease)    Past Surgical History  Procedure Date  . Foot surgery     as baby  . Kidney stone surgery   . Savory dilation 03/19/2012    Procedure: SAVORY DILATION;  Surgeon: West Bali, MD;  Location: [facility];  Service: Endoscopy;  Laterality: N/A;  . Esophageal biopsy 03/19/2012    Procedure: BIOPSY;  Surgeon: West Bali, MD;  Location: [facility];  Service: Endoscopy;  Laterality: N/A;  gastric and esophageal biospies    No Known Allergies  Current Outpatient Prescriptions  Medication Sig Dispense Refill  . ARIPiprazole (ABILIFY) 15 MG tablet Take 15 mg by mouth at bedtime.       . calcium carbonate (OS-CAL) 600 MG TABS Take 600 mg by mouth daily.        . clonazePAM (KLONOPIN) 0.5 MG tablet Take 0.5 mg by mouth 3 (three) times daily as needed. For nerves      . divalproex (DEPAKOTE ER) 250 MG 24 hr tablet Take 750 mg by mouth at bedtime.       . medroxyPROGESTERone (PROVERA) 10 MG tablet Take 10 mg by mouth every morning.      Marland Kitchen omeprazole (PRILOSEC) 20 MG capsule 1 by mouth daily 30 minutes before breakfast         Review of Systems     Objective:   Physical Exam  Vitals reviewed. Constitutional: She appears well-nourished. No distress.  HENT:  Head: Normocephalic and atraumatic.  Neck: Normal range of motion.  Cardiovascular: Normal rate, regular rhythm and normal heart sounds.   Pulmonary/Chest: Effort  normal and breath sounds normal. No respiratory distress.  Abdominal: Soft. Bowel sounds are normal. She exhibits no distension.  Musculoskeletal: She exhibits no edema.  Neurological: She is alert.          Assessment & Plan:

## 2012-05-03 ENCOUNTER — Telehealth: Payer: Self-pay | Admitting: Gastroenterology

## 2012-05-03 MED ORDER — OMEPRAZOLE 20 MG PO CPDR: DELAYED_RELEASE_CAPSULE | ORAL | Status: DC

## 2012-05-03 NOTE — Telephone Encounter (Signed)
CALLED FAMILY TO DISCUSS BPE-NO ANSWERING MACHINE OR ANSWER. SHOWS REFLUX, AND ASPIRATION. NL ESO MOTILITY. NEEDS BID OMP. RX SENT

## 2012-05-05 NOTE — Telephone Encounter (Signed)
CALLED HOME-NO ANSWER. CALLED CELL # FOR PT-NOT A WORKING # AND FOR FATHER-NO ANSWER/LVM-CALL [phone] TO DISCUSS.

## 2012-05-06 NOTE — Telephone Encounter (Signed)
Results Cc to PCP  

## 2012-05-06 NOTE — Telephone Encounter (Signed)
Send letter for pt's family to contact office for results.

## 2012-05-29 ENCOUNTER — Ambulatory Visit (INDEPENDENT_AMBULATORY_CARE_PROVIDER_SITE_OTHER): Payer: Medicaid Other | Admitting: Gastroenterology

## 2012-05-29 ENCOUNTER — Encounter: Payer: Self-pay | Admitting: Gastroenterology

## 2012-05-29 VITALS — BP 96/65 | HR 65 | Temp 96.6°F | Ht 62.0 in | Wt 154.4 lb

## 2012-05-29 DIAGNOSIS — R140 Abdominal distension (gaseous): Secondary | ICD-10-CM

## 2012-05-29 DIAGNOSIS — R1310 Dysphagia, unspecified: Secondary | ICD-10-CM

## 2012-05-29 DIAGNOSIS — K219 Gastro-esophageal reflux disease without esophagitis: Secondary | ICD-10-CM

## 2012-05-29 DIAGNOSIS — R141 Gas pain: Secondary | ICD-10-CM

## 2012-05-29 NOTE — Assessment & Plan Note (Signed)
?   DUE TO FOOD INTOLERANCE OR MEDS, LESS LIKELY IBS OR SIBO.   ADD PROBIOTIC DAILY.  AVOID FOODS THAT CAUSE BLOATING.  CONTINUE OMEPRAZOLE. MAY TRY OMEPRAZOLE ONLY AT NIGHT.  FOLLOW UP IN 4 MOS. CONSIDER CT A/P IF CONTINUES.

## 2012-05-29 NOTE — Assessment & Plan Note (Signed)
IDEALLY CONTROLLED.    CONTINUE OMEPRAZOLE. MAY TRY OMEPRAZOLE ONLY AT NIGHT.  FOLLOW UP IN 4 MOS.

## 2012-05-29 NOTE — Patient Instructions (Addendum)
MONITOR SWALLOWING WITH LIQUIDS.  ADD PROBIOTIC DAILY.  CONTINUE OMEPRAZOLE. MAY TRY OMEPRAZOLE ONLY AT NIGHT.  FOLLOW UP IN 4 MOS.

## 2012-05-29 NOTE — Progress Notes (Signed)
Faxed to PCP

## 2012-05-29 NOTE — Progress Notes (Signed)
  Subjective:    Patient ID: Diana Ho, female    DOB: 15-Jul-1981, 31 y.o.   MRN: [id number]  PCP: [place]  HPI LAST SEEN MAY 2013-BPE SHOWED SEVERE GERD IN SUPINE POSITION, NL ESO MOTILITY, NO STRICTURE.  NO CHOKING AT NIGHT ON BID PPI. STILL PROBLEMS WITH LIQUIDS IF SHE DRINKS FAST?. SIGNIFICANTLY IMPROVED. BMs GOOD.  C/O BLOATING AND WEIGHT GAIN. WEIGHT 154 LBS SINCE MAR 2013. TAKE MEDS AFTER MEALS. BMs; AT LEAST 2 X/DAY. MAY HAVE DIARRHEA AFTER FRUITS.   Past Medical History  Diagnosis Date  . Bipolar 1 disorder   . Renal disorder   . Autistic disorder   . Schizophrenia   . Cervical atypia   . Kidney stones   . Asthma   . GERD (gastroesophageal reflux disease)     Past Surgical History  Procedure Date  . Foot surgery     as baby  . Kidney stone surgery   . Savory dilation 03/19/2012    Procedure: SAVORY DILATION;  Surgeon: West Bali, MD;  Location: [facility];  Service: Endoscopy;  Laterality: N/A;  . Esophageal biopsy 03/19/2012    Procedure: BIOPSY;  Surgeon: West Bali, MD;  Location: [facility];  Service: Endoscopy;  Laterality: N/A;  gastric and esophageal biospies    No Known Allergies  Current Outpatient Prescriptions  Medication Sig Dispense Refill  . ARIPiprazole (ABILIFY) 15 MG tablet Take 15 mg by mouth at bedtime.       . calcium carbonate (OS-CAL) 600 MG TABS Take 600 mg by mouth daily.        . clonazePAM (KLONOPIN) 0.5 MG tablet Take 0.5 mg by mouth 3 (three) times daily as needed. For nerves      . divalproex (DEPAKOTE ER) 250 MG 24 hr tablet Take 750 mg by mouth at bedtime.       . medroxyPROGESTERone (PROVERA) 10 MG tablet Take 10 mg by mouth every morning.    Marland Kitchen omeprazole (PRILOSEC) 20 MG capsule 1 by mouth daily 30 minutes before breakfast AND SUPPER        Review of Systems     Objective:   Physical Exam  Vitals reviewed. Constitutional: She is oriented to person, place, and time. No distress.  HENT:  Head: Normocephalic and  atraumatic.  Mouth/Throat: Oropharynx is clear and moist. No oropharyngeal exudate.  Eyes: Pupils are equal, round, and reactive to light.  Neck: Normal range of motion. Neck supple.  Cardiovascular: Normal rate, regular rhythm and normal heart sounds.   Pulmonary/Chest: Effort normal and breath sounds normal.  Abdominal: Soft. Bowel sounds are normal. She exhibits no distension. There is no tenderness. There is no rebound.  Musculoskeletal: She exhibits no edema.  Neurological: She is alert and oriented to person, place, and time.       NO  NEW FOCAL DEFICITS           Assessment & Plan:

## 2012-05-29 NOTE — Assessment & Plan Note (Signed)
Sx IMPROVED-MAY GET CHOKED WITH LIQUIDS.   MONITOR SWALLOWING WITH LIQUIDS.  CONTINUE OMEPRAZOLE. MAY TRY OMEPRAZOLE ONLY AT NIGHT.  FOLLOW UP IN 4 MOS.

## 2012-05-30 NOTE — Progress Notes (Signed)
Reminder in epic to follow up in 4 months with SF in E30 

## 2012-06-27 ENCOUNTER — Encounter: Payer: Self-pay | Admitting: Family Medicine

## 2012-06-27 ENCOUNTER — Ambulatory Visit (INDEPENDENT_AMBULATORY_CARE_PROVIDER_SITE_OTHER): Payer: Medicaid Other | Admitting: Family Medicine

## 2012-06-27 VITALS — BP 90/70 | HR 76 | Resp 16 | Ht 62.0 in | Wt 155.0 lb

## 2012-06-27 DIAGNOSIS — R141 Gas pain: Secondary | ICD-10-CM

## 2012-06-27 DIAGNOSIS — E663 Overweight: Secondary | ICD-10-CM

## 2012-06-27 DIAGNOSIS — K219 Gastro-esophageal reflux disease without esophagitis: Secondary | ICD-10-CM

## 2012-06-27 DIAGNOSIS — R143 Flatulence: Secondary | ICD-10-CM

## 2012-06-27 DIAGNOSIS — R140 Abdominal distension (gaseous): Secondary | ICD-10-CM

## 2012-06-27 NOTE — Patient Instructions (Signed)
Try acidophilus ( probiotic) or Activia Continue current meds F/U with GYN F/U 6 months

## 2012-06-27 NOTE — Assessment & Plan Note (Signed)
Improved reviewed GI note

## 2012-06-27 NOTE — Assessment & Plan Note (Signed)
Unchanged, recommended increase in activity

## 2012-06-27 NOTE — Assessment & Plan Note (Signed)
Advised OTC probiotic as not covered by insurance, trial of [place], reviewed GI note

## 2012-06-27 NOTE — Progress Notes (Signed)
  Subjective:    Patient ID: Diana Ho, female    DOB: 1981/01/20, 31 y.o.   MRN: [id number]  HPI  Pt here to f/u chronic medical problems, doing well, no concerns No recent changes to medications by psychiatry, no behavior problems past few months, recently went on first beach trip. GERD- seen by GI for dysphagia- s/p esophogeal dilation, on PPI , she continues to have some bloating was given probiotics but mother finds them to expensive asking for alternative  Amenorrhea- followed by GYN, had 1 course of provera, currently on cycle first since November 2012   Review of Systems   GEN- denies fatigue, fever, weight loss,weakness, recent illness HEENT- denies eye drainage, change in vision, nasal discharge, CVS- denies chest pain, palpitations RESP- denies SOB, cough, wheeze ABD- denies N/V, change in stools, abd pain GU- denies dysuria, hematuria, dribbling, incontinence MSK- denies joint pain, muscle aches, injury Neuro- denies headache, dizziness, syncope, seizure activity      Objective:   Physical Exam GEN- NAD, alert and oriented x3 HEENT- PERRL, EOMI, non injected sclera, pink conjunctiva, MMM, oropharynx clear Neck- Supple,  CVS- RRR, no murmur RESP-CTAB ABD-NABS,soft,nt,nd EXT- No edema Pulses- Radial, DP- 2+        Assessment & Plan:

## 2012-08-29 ENCOUNTER — Encounter: Payer: Self-pay | Admitting: *Deleted

## 2012-11-18 ENCOUNTER — Encounter: Payer: Self-pay | Admitting: Family Medicine

## 2012-11-18 ENCOUNTER — Ambulatory Visit (INDEPENDENT_AMBULATORY_CARE_PROVIDER_SITE_OTHER): Payer: Medicaid Other | Admitting: Family Medicine

## 2012-11-18 VITALS — BP 102/68 | HR 86 | Temp 98.3°F | Resp 18 | Ht 62.0 in | Wt 165.0 lb

## 2012-11-18 DIAGNOSIS — J45901 Unspecified asthma with (acute) exacerbation: Secondary | ICD-10-CM

## 2012-11-18 MED ORDER — GUAIFENESIN-CODEINE 100-10 MG/5ML PO SOLN: 5.0000 mL | Freq: Three times a day (TID) | ORAL | Status: DC | PRN

## 2012-11-18 MED ORDER — PREDNISONE 10 MG PO TABS: ORAL_TABLET | ORAL | Status: DC

## 2012-11-18 MED ORDER — AZITHROMYCIN 250 MG PO TABS: ORAL_TABLET | ORAL | Status: AC

## 2012-11-18 MED ORDER — ALBUTEROL SULFATE HFA 108 (90 BASE) MCG/ACT IN AERS: 2.0000 | INHALATION_SPRAY | RESPIRATORY_TRACT | Status: DC | PRN

## 2012-11-18 NOTE — Patient Instructions (Signed)
Prednisone and antibiotics for the asthma Inhaler refilled Call if she does not improve  Cough medicine

## 2012-11-18 NOTE — Progress Notes (Signed)
  Subjective:    Patient ID: Diana Ho, female    DOB: Oct 31, 1981, 31 y.o.   MRN: [id number]  HPI Patient presents with cough and wheezing for the past 3 days she also had associated diarrhea x1 day and sore throat. No sick contacts. She denies any fever. Her mother has been taking care of her in the last 2 nights her she coughed turn into wheezing episodes and she had to give her her nebulizer because she was out of her albuterol inhaler. She improved with the nebulizer treatment.   Review of Systems - per above  GEN- + fatigue, fever, weight loss,weakness, recent illness HEENT- denies eye drainage, change in vision, +nasal discharge, CVS- denies chest pain, palpitations RESP- denies SOB, +cough, +wheeze ABD- denies N/V, change in stools, abd pain Skin- denies rash       Objective:   Physical Exam  GEN- NAD, alert and oriented x3 HEENT- PERRL, EOMI, non injected sclera, pink conjunctiva, MMM, oropharynx mild injection, TM clear bilat no effusion, no maxillary sinus tenderness, inflammed turbinates,  +Nasal drainage  Neck- Supple, no LAD CVS- RRR, no murmur RESP-CTAB EXT- No edema Pulses- Radial 2+        Assessment & Plan:

## 2012-11-18 NOTE — Assessment & Plan Note (Signed)
Mild exacerbation in setting of viral illness that initially started symptoms, but not improving Low dose prednisone, albuterol refilled, zpak given

## 2012-12-23 ENCOUNTER — Ambulatory Visit: Payer: Medicaid Other | Admitting: Family Medicine

## 2013-02-10 ENCOUNTER — Encounter: Payer: Self-pay | Admitting: Family Medicine

## 2013-02-10 DIAGNOSIS — F79 Unspecified intellectual disabilities: Secondary | ICD-10-CM | POA: Insufficient documentation

## 2013-02-28 ENCOUNTER — Ambulatory Visit: Payer: Medicaid Other | Admitting: Family Medicine

## 2013-03-06 ENCOUNTER — Ambulatory Visit: Payer: Medicaid Other | Admitting: Family Medicine

## 2013-03-07 ENCOUNTER — Ambulatory Visit: Payer: Medicaid Other | Admitting: Family Medicine

## 2013-03-28 ENCOUNTER — Other Ambulatory Visit: Payer: Self-pay

## 2013-03-28 MED ORDER — DIVALPROEX SODIUM ER 250 MG PO TB24: ORAL_TABLET | ORAL | Status: DC

## 2013-03-28 MED ORDER — ARIPIPRAZOLE 20 MG PO TABS: 20.0000 mg | ORAL_TABLET | Freq: Every day | ORAL | Status: DC

## 2013-04-14 ENCOUNTER — Ambulatory Visit (INDEPENDENT_AMBULATORY_CARE_PROVIDER_SITE_OTHER): Payer: Medicaid Other

## 2013-04-14 ENCOUNTER — Ambulatory Visit: Payer: Medicaid Other | Admitting: Family Medicine

## 2013-04-14 VITALS — BP 106/68 | Wt 162.0 lb

## 2013-04-14 DIAGNOSIS — Z111 Encounter for screening for respiratory tuberculosis: Secondary | ICD-10-CM

## 2013-04-14 NOTE — Progress Notes (Signed)
Patient in for  PPD placement.  PPD placed in right lower forearm.  Facility aware that patient should return on Wednesday

## 2013-05-23 ENCOUNTER — Ambulatory Visit (INDEPENDENT_AMBULATORY_CARE_PROVIDER_SITE_OTHER): Payer: Medicaid Other | Admitting: Family Medicine

## 2013-05-23 VITALS — BP 110/80 | HR 98 | Temp 97.2°F | Resp 22 | Ht 62.0 in | Wt 161.0 lb

## 2013-05-23 DIAGNOSIS — F79 Unspecified intellectual disabilities: Secondary | ICD-10-CM

## 2013-05-23 DIAGNOSIS — M549 Dorsalgia, unspecified: Secondary | ICD-10-CM

## 2013-05-23 DIAGNOSIS — F319 Bipolar disorder, unspecified: Secondary | ICD-10-CM

## 2013-05-23 DIAGNOSIS — N912 Amenorrhea, unspecified: Secondary | ICD-10-CM

## 2013-05-23 DIAGNOSIS — E663 Overweight: Secondary | ICD-10-CM

## 2013-05-23 DIAGNOSIS — Z309 Encounter for contraceptive management, unspecified: Secondary | ICD-10-CM

## 2013-05-23 DIAGNOSIS — R300 Dysuria: Secondary | ICD-10-CM

## 2013-05-23 LAB — URINALYSIS, ROUTINE W REFLEX MICROSCOPIC
Bilirubin Urine: NEGATIVE
Protein, ur: NEGATIVE mg/dL
Urobilinogen, UA: 0.2 mg/dL (ref 0.0–1.0)

## 2013-05-23 MED ORDER — OMEPRAZOLE 20 MG PO CPDR: DELAYED_RELEASE_CAPSULE | ORAL | Status: DC

## 2013-05-23 MED ORDER — ACETAMINOPHEN 500 MG PO TABS: 500.0000 mg | ORAL_TABLET | Freq: Two times a day (BID) | ORAL | Status: DC | PRN

## 2013-05-23 NOTE — Patient Instructions (Addendum)
Please see home away from home sheet

## 2013-05-25 ENCOUNTER — Encounter: Payer: Self-pay | Admitting: Family Medicine

## 2013-05-25 NOTE — Assessment & Plan Note (Signed)
Followed by psychiatry 

## 2013-05-25 NOTE — Assessment & Plan Note (Addendum)
MSK pain lower back, short lived normal exam, tylenol BID Xrays in past showed mild scoliosis, she was seeing chiropractor in the past.  UA neg, no signs of infection

## 2013-05-25 NOTE — Assessment & Plan Note (Signed)
Currently in Group Home doing well

## 2013-05-25 NOTE — Assessment & Plan Note (Signed)
Refer back to GYN to see if provera is needed to continue or other contraception can be given

## 2013-05-25 NOTE — Progress Notes (Signed)
  Subjective:    Patient ID: Diana Ho, female    DOB: 12-Mar-1981, 32 y.o.   MRN: [id number]  HPI Pt here to f/u chronic medical problems. Doing well in the her Group Home. No behavioral problems. Overdue for GYN follow-up, no longer on hormone therapy for the amenorrhea, but Group Home was concerned what she was on for contraception. Lower back pain for past few days, denies any injury, no paresthesia in lower ext. +pressure with urination, denies dysuria.    Review of Systems - per above   GEN- denies fatigue, fever, weight loss,weakness, recent illness HEENT- denies eye drainage, change in vision, nasal discharge, CVS- denies chest pain, palpitations RESP- denies SOB, cough, wheeze ABD- denies N/V, change in stools, abd pain GU- denies dysuria, hematuria, dribbling, incontinence MSK- + joint pain, muscle aches, injury       Objective:   Physical Exam  GEN- NAD, alert and oriented x3 HEENT- PERRL, EOMI, non injected sclera, pink conjunctiva, MMM, oropharynx clear CVS- RRR, no murmur RESP-CTAB ABD-NABS,soft,mild TTP suprapubic tenderness, no rebound, no guarding,ND, no CVA tendeneress MSK- Back- Spine NT, no paraspinal spasm, neg SLR EXT- No edema Pulses- Radial, DP- 2+       Assessment & Plan:

## 2013-05-25 NOTE — Assessment & Plan Note (Signed)
She has lost a few pounds, healthier meals being prepared

## 2013-06-13 ENCOUNTER — Ambulatory Visit (INDEPENDENT_AMBULATORY_CARE_PROVIDER_SITE_OTHER): Payer: Medicaid Other | Admitting: Obstetrics and Gynecology

## 2013-06-13 ENCOUNTER — Encounter: Payer: Self-pay | Admitting: Obstetrics and Gynecology

## 2013-06-13 ENCOUNTER — Other Ambulatory Visit (HOSPITAL_COMMUNITY)
Admission: RE | Admit: 2013-06-13 | Discharge: 2013-06-13 | Disposition: A | Discharge status: Home / Self Care | Payer: Medicaid Other | Source: Ambulatory Visit | Attending: Obstetrics and Gynecology | Admitting: Obstetrics and Gynecology

## 2013-06-13 VITALS — BP 100/60 | Ht 62.0 in | Wt 161.0 lb

## 2013-06-13 DIAGNOSIS — Z0000 Encounter for general adult medical examination without abnormal findings: Secondary | ICD-10-CM

## 2013-06-13 DIAGNOSIS — N912 Amenorrhea, unspecified: Secondary | ICD-10-CM

## 2013-06-13 DIAGNOSIS — Z01419 Encounter for gynecological examination (general) (routine) without abnormal findings: Secondary | ICD-10-CM

## 2013-06-13 DIAGNOSIS — Z1151 Encounter for screening for human papillomavirus (HPV): Secondary | ICD-10-CM | POA: Insufficient documentation

## 2013-06-13 MED ORDER — MEDROXYPROGESTERONE ACETATE 10 MG PO TABS: 10.0000 mg | ORAL_TABLET | Freq: Every day | ORAL | Status: DC

## 2013-06-13 NOTE — Progress Notes (Signed)
  Assessment:  Normal Gyn Exam chronic anovulation    Plan:    1. pap smear done, next pap due 3 yrs.  2. Will withdraw annually with provera x 14 days to reduce chance of tissue buildup and/or BTB. 3. return annually or prn  Subjective:  Diana Ho is a 32 y.o. female G0P0 who presents for annual exam.  The patient has complaints today of  Chronic anovulation, seizure disordre,  Bipolar I, lives in group home , not sexually active in over 4 yrs.  The following portions of the patient's history were reviewed and updated as appropriate: allergies, current medications, past family history, past medical history, past social history, past surgical history and problem list.  Review of Systems A comprehensive review of systems was negative.  Objective:  BP 100/60  Ht 5\' 2"  (1.575 m)  Wt 161 lb (73.029 kg)  BMI 29.44 kg/m2  BMI: Body mass index is 29.44 kg/(m^2). General Appearance: Alert, appropriate appearance for age. No acute distress HEENT: Grossly normal Neck / Thyroid:  Cardiovascular: RRR; normal S1, S2, no murmur Lungs: CTA bilaterally Back: No CVAT Breast Exam: No dimpling, nipple retraction or discharge. No masses or nodes., Normal to inspection, Normal breast tissue bilaterally and No masses or nodes.No dimpling, nipple retraction or discharge. Gastrointestinal: Soft, non-tender, no masses or organomegaly Pelvic Exam: Vulva and vagina appear normal. Bimanual exam reveals normal uterus and adnexa. fern pos mucus c/w chronic anovulation. estrogenized vag epithelium Rectovaginal: not indicated Lymphatic Exam: Non-palpable nodes in neck, clavicular, axillary, or inguinal regions Skin: no rash or abnormalities Neurologic: Normal gait and speech, no tremor  Psychiatric: Alert and oriented, appropriate affect.  Urinalysis:Not done  Christin Bach. MD Pgr [phone] 10:13 AM

## 2013-06-13 NOTE — Patient Instructions (Signed)
Take provera x 2 weeks, to see if a period can be produced .  We will repeat yearly.

## 2013-06-24 ENCOUNTER — Ambulatory Visit (INDEPENDENT_AMBULATORY_CARE_PROVIDER_SITE_OTHER): Payer: Medicaid Other | Admitting: Family Medicine

## 2013-06-24 DIAGNOSIS — Z111 Encounter for screening for respiratory tuberculosis: Secondary | ICD-10-CM

## 2013-06-24 NOTE — Progress Notes (Signed)
Patient ID: Diana Ho, female   DOB: 1981/08/03, 32 y.o.   MRN: [id number] Patient lives in Group Home.  PPD administered here and is read by nurse at her Group Home.

## 2013-07-18 ENCOUNTER — Other Ambulatory Visit: Payer: Medicaid Other | Admitting: Obstetrics and Gynecology

## 2013-07-21 ENCOUNTER — Telehealth: Payer: Self-pay | Admitting: Family Medicine

## 2013-07-21 NOTE — Telephone Encounter (Signed)
This is filled by psychiatry.

## 2013-07-21 NOTE — Telephone Encounter (Signed)
?   OK to Refill  

## 2013-07-21 NOTE — Telephone Encounter (Signed)
Clonazepam 1 mg tab take 1/2 tab QD prn #15

## 2013-07-22 NOTE — Telephone Encounter (Signed)
Pharmacy aware

## 2013-07-24 ENCOUNTER — Other Ambulatory Visit: Payer: Self-pay | Admitting: Family Medicine

## 2013-07-24 NOTE — Telephone Encounter (Signed)
?   OK to Refill  

## 2013-07-24 NOTE — Telephone Encounter (Signed)
Okay to refill? 

## 2013-08-08 ENCOUNTER — Encounter (HOSPITAL_COMMUNITY): Payer: Self-pay | Admitting: *Deleted

## 2013-08-08 ENCOUNTER — Emergency Department (HOSPITAL_COMMUNITY)
Admission: EM | Admit: 2013-08-08 | Discharge: 2013-08-09 | Disposition: A | Discharge status: Home / Self Care | Payer: Medicaid Other | Attending: Emergency Medicine | Admitting: Emergency Medicine

## 2013-08-08 DIAGNOSIS — Z87891 Personal history of nicotine dependence: Secondary | ICD-10-CM | POA: Insufficient documentation

## 2013-08-08 DIAGNOSIS — Z79899 Other long term (current) drug therapy: Secondary | ICD-10-CM | POA: Insufficient documentation

## 2013-08-08 DIAGNOSIS — F209 Schizophrenia, unspecified: Secondary | ICD-10-CM | POA: Insufficient documentation

## 2013-08-08 DIAGNOSIS — F319 Bipolar disorder, unspecified: Secondary | ICD-10-CM | POA: Insufficient documentation

## 2013-08-08 DIAGNOSIS — Z87448 Personal history of other diseases of urinary system: Secondary | ICD-10-CM | POA: Insufficient documentation

## 2013-08-08 DIAGNOSIS — Z3202 Encounter for pregnancy test, result negative: Secondary | ICD-10-CM | POA: Insufficient documentation

## 2013-08-08 DIAGNOSIS — K219 Gastro-esophageal reflux disease without esophagitis: Secondary | ICD-10-CM | POA: Insufficient documentation

## 2013-08-08 DIAGNOSIS — Z9889 Other specified postprocedural states: Secondary | ICD-10-CM | POA: Insufficient documentation

## 2013-08-08 DIAGNOSIS — J45909 Unspecified asthma, uncomplicated: Secondary | ICD-10-CM | POA: Insufficient documentation

## 2013-08-08 DIAGNOSIS — R109 Unspecified abdominal pain: Secondary | ICD-10-CM | POA: Insufficient documentation

## 2013-08-08 DIAGNOSIS — F840 Autistic disorder: Secondary | ICD-10-CM | POA: Insufficient documentation

## 2013-08-08 DIAGNOSIS — Z87442 Personal history of urinary calculi: Secondary | ICD-10-CM | POA: Insufficient documentation

## 2013-08-08 DIAGNOSIS — R1030 Lower abdominal pain, unspecified: Secondary | ICD-10-CM

## 2013-08-08 DIAGNOSIS — Z8742 Personal history of other diseases of the female genital tract: Secondary | ICD-10-CM | POA: Insufficient documentation

## 2013-08-08 NOTE — ED Notes (Signed)
Pt is a resident of a group.  Complaints of abdominal pain and pelvic pain.  Guardian believes pregnancy is a problem as well.
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
## 2013-08-09 LAB — URINALYSIS, ROUTINE W REFLEX MICROSCOPIC
Bilirubin Urine: NEGATIVE
Ketones, ur: NEGATIVE mg/dL
Nitrite: NEGATIVE
Protein, ur: NEGATIVE mg/dL
Specific Gravity, Urine: >1.03 — ABNORMAL HIGH (ref 1.005–1.030)
Urobilinogen, UA: 0.2 mg/dL (ref 0.0–1.0)

## 2013-08-09 LAB — WET PREP, GENITAL: Trich, Wet Prep: NONE SEEN

## 2013-08-09 LAB — HIV ANTIBODY (ROUTINE TESTING W REFLEX): HIV: NONREACTIVE

## 2013-08-11 LAB — GC/CHLAMYDIA PROBE AMP
CT Probe RNA: NEGATIVE
GC Probe RNA: NEGATIVE

## 2013-08-14 ENCOUNTER — Ambulatory Visit: Payer: Medicaid Other | Admitting: Adult Health

## 2013-09-01 ENCOUNTER — Encounter: Payer: Self-pay | Admitting: Obstetrics and Gynecology

## 2013-09-01 ENCOUNTER — Ambulatory Visit (INDEPENDENT_AMBULATORY_CARE_PROVIDER_SITE_OTHER): Payer: Medicaid Other | Admitting: Obstetrics and Gynecology

## 2013-09-01 ENCOUNTER — Other Ambulatory Visit (HOSPITAL_COMMUNITY)
Admission: RE | Admit: 2013-09-01 | Discharge: 2013-09-01 | Disposition: A | Discharge status: Home / Self Care | Payer: Medicaid Other | Source: Ambulatory Visit | Attending: Obstetrics and Gynecology | Admitting: Obstetrics and Gynecology

## 2013-09-01 VITALS — BP 102/70 | Ht 62.0 in | Wt 156.2 lb

## 2013-09-01 DIAGNOSIS — Z1151 Encounter for screening for human papillomavirus (HPV): Secondary | ICD-10-CM | POA: Insufficient documentation

## 2013-09-01 DIAGNOSIS — Z01419 Encounter for gynecological examination (general) (routine) without abnormal findings: Secondary | ICD-10-CM | POA: Insufficient documentation

## 2013-09-01 DIAGNOSIS — Z113 Encounter for screening for infections with a predominantly sexual mode of transmission: Secondary | ICD-10-CM | POA: Insufficient documentation

## 2013-09-01 DIAGNOSIS — IMO0002 Reserved for concepts with insufficient information to code with codable children: Secondary | ICD-10-CM

## 2013-09-01 DIAGNOSIS — Z3202 Encounter for pregnancy test, result negative: Secondary | ICD-10-CM
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
## 2013-09-01 NOTE — Addendum Note (Signed)
Addended by: Criss Alvine on: 09/01/2013 03:44 PM   Modules accepted: Orders

## 2013-09-01 NOTE — Patient Instructions (Addendum)
We will call you if any of the results of the pap smear are abnormal.   We will have you come in to office for an Nexplanon in 10 days.Birth control Etonogestrel implant What is this medicine? ETONOGESTREL is a contraceptive (birth control) device. It is used to prevent pregnancy. It can be used for up to 3 years. This medicine may be used for other purposes; ask your health care provider or pharmacist if you have questions. What should I tell my health care provider before I take this medicine? They need to know if you have any of these conditions: -abnormal vaginal bleeding -blood vessel disease or blood clots -cancer of the breast, cervix, or liver -depression -diabetes -gallbladder disease -headaches -heart disease or recent heart attack -high blood pressure -high cholesterol -kidney disease -liver disease -renal disease -seizures -tobacco smoker -an unusual or allergic reaction to etonogestrel, other hormones, anesthetics or antiseptics, medicines, foods, dyes, or preservatives -pregnant or trying to get pregnant -breast-feeding How should I use this medicine? This device is inserted just under the skin on the inner side of your upper arm by a health care professional. Talk to your pediatrician regarding the use of this medicine in children. Special care may be needed. Overdosage: If you think you've taken too much of this medicine contact a poison control center or emergency room at once. Overdosage: If you think you have taken too much of this medicine contact a poison control center or emergency room at once. NOTE: This medicine is only for you. Do not share this medicine with others. What if I miss a dose? This does not apply. What may interact with this medicine? Do not take this medicine with any of the following medications: -amprenavir -bosentan -fosamprenavir This medicine may also interact with the following medications: -barbiturate medicines for inducing  sleep or treating seizures -certain medicines for fungal infections like ketoconazole and itraconazole -griseofulvin -medicines to treat seizures like carbamazepine, felbamate, oxcarbazepine, phenytoin, topiramate -modafinil -phenylbutazone -rifampin -some medicines to treat HIV infection like atazanavir, indinavir, lopinavir, nelfinavir, tipranavir, ritonavir -St. Arlisha Patalano's wort This list may not describe all possible interactions. Give your health care provider a list of all the medicines, herbs, non-prescription drugs, or dietary supplements you use. Also tell them if you smoke, drink alcohol, or use illegal drugs. Some items may interact with your medicine. What should I watch for while using this medicine? This product does not protect you against HIV infection (AIDS) or other sexually transmitted diseases. You should be able to feel the implant by pressing your fingertips over the skin where it was inserted. Tell your doctor if you cannot feel the implant. What side effects may I notice from receiving this medicine? Side effects that you should report to your doctor or health care professional as soon as possible: -allergic reactions like skin rash, itching or hives, swelling of the face, lips, or tongue -breast lumps -changes in vision -confusion, trouble speaking or understanding -dark urine -depressed mood -general ill feeling or flu-like symptoms -light-colored stools -loss of appetite, nausea -right upper belly pain -severe headaches -severe pain, swelling, or tenderness in the abdomen -shortness of breath, chest pain, swelling in a leg -signs of pregnancy -sudden numbness or weakness of the face, arm or leg -trouble walking, dizziness, loss of balance or coordination -unusual vaginal bleeding, discharge -unusually weak or tired -yellowing of the eyes or skin Side effects that usually do not require medical attention (Report these to your doctor or health care professional  if  they continue or are bothersome.): -acne -breast pain -changes in weight -cough -fever or chills -headache -irregular menstrual bleeding -itching, burning, and vaginal discharge -pain or difficulty passing urine -sore throat This list may not describe all possible side effects. Call your doctor for medical advice about side effects. You may report side effects to FDA at 1-800-FDA-1088. Where should I keep my medicine? This drug is given in a hospital or clinic and will not be stored at home. NOTE: This sheet is a summary. It may not cover all possible information. If you have questions about this medicine, talk to your doctor, pharmacist, or health care provider.  2013, [employer]. (08/13/2009 3:54:17 PM)

## 2013-09-09 ENCOUNTER — Telehealth: Payer: Self-pay | Admitting: Family Medicine

## 2013-09-09 NOTE — Telephone Encounter (Signed)
These forms were mailed ouit on 09/09/13

## 2013-09-09 NOTE — Telephone Encounter (Signed)
Darel Hong called from [facility] and stated that [employer] FL2 forms were received but the doctor's signature did not appear on the bottom. Her prorate papers need to be faxed back as well. Please refax ASAP.

## 2013-09-10 NOTE — Telephone Encounter (Signed)
Left message with pt to return my call °

## 2013-09-10 NOTE — Telephone Encounter (Signed)
Mom came up to office to collect FL2 forms, forms were mailed out two days ago and faxed to [facility]

## 2013-09-10 NOTE — Telephone Encounter (Signed)
Patient 's mother calling to find out about her status  of her daughters forms.

## 2013-09-11 ENCOUNTER — Encounter: Payer: Self-pay | Admitting: Advanced Practice Midwife

## 2013-09-11 ENCOUNTER — Encounter (INDEPENDENT_AMBULATORY_CARE_PROVIDER_SITE_OTHER): Payer: Self-pay

## 2013-09-11 ENCOUNTER — Ambulatory Visit (INDEPENDENT_AMBULATORY_CARE_PROVIDER_SITE_OTHER): Payer: Medicaid Other | Admitting: Advanced Practice Midwife

## 2013-09-11 VITALS — BP 90/60 | Ht 64.0 in | Wt 157.0 lb

## 2013-09-11 DIAGNOSIS — Z30017 Encounter for initial prescription of implantable subdermal contraceptive: Secondary | ICD-10-CM

## 2013-09-11 DIAGNOSIS — Z3202 Encounter for pregnancy test, result negative: Secondary | ICD-10-CM

## 2013-09-11 LAB — POCT URINE PREGNANCY: Preg Test, Ur: NEGATIVE

## 2013-09-11 NOTE — Progress Notes (Signed)
Diana Ho 32 y.o. Here for Nexplanon insertion.  She lives in a group home and has been having intercourse with her "boyfriend".  Mother is concerned that this may have been coerced.  Pt has been sexually active in the past.  Past Medical History  Diagnosis Date  . Bipolar 1 disorder   . Renal disorder   . Autistic disorder   . Schizophrenia   . Cervical atypia   . Kidney stones   . Asthma   . GERD (gastroesophageal reflux disease)     Past Surgical History  Procedure Laterality Date  . Foot surgery      as baby  . Kidney stone surgery    . Savory dilation  03/19/2012    Procedure: SAVORY DILATION;  Surgeon: West Bali, MD;  Location: [facility];  Service: Endoscopy;  Laterality: N/A;  . Esophageal biopsy  03/19/2012    Procedure: BIOPSY;  Surgeon: West Bali, MD;  Location: [facility];  Service: Endoscopy;  Laterality: N/A;  gastric and esophageal biospies    Diana Ho is a 32 y.o. year old Caucasian female here for Nexplanon insertion.  Her LMP was 09/02/13, (pt has chronic anovulation) , and her pregnancy test today was negative.  Risks/benefits/side effects of Nexplanon have been discussed and her questions have been answered.  Specifically, a failure rate of 12/998 has been reported, with an increased failure rate if pt takes St. John's Wort and/or antiseizure medicaitons.  Diana Ho is aware of the common side effect of irregular bleeding, which the incidence of decreases over time.  Her left arm, approximatly 4 inches proximal from the elbow, was cleansed with alcohol and anesthetized with 2cc of 2% Lidocaine.  The area was cleansed again and the Nexplanon was inserted without difficulty.  A pressure bandage was applied.  Pt was instructed to remove pressure bandage in a few hours, and keep insertion site covered with a bandaid for 3 days.  Back up contraception was recommended for 3 weeks.  Follow-up scheduled PRN  problems  [name],Diana Ho 09/11/2013 2:00 PM

## 2013-09-25 ENCOUNTER — Telehealth: Payer: Self-pay | Admitting: Family Medicine

## 2013-09-25 MED ORDER — ARIPIPRAZOLE 20 MG PO TABS: 20.0000 mg | ORAL_TABLET | Freq: Every day | ORAL | Status: DC

## 2013-09-25 NOTE — Telephone Encounter (Signed)
Needs refill on Abilify called in to [facility] so mother can pick-up medicine

## 2013-09-25 NOTE — Telephone Encounter (Signed)
Meds refilled.

## 2013-10-02 ENCOUNTER — Other Ambulatory Visit: Payer: Self-pay | Admitting: Family Medicine

## 2013-10-02 ENCOUNTER — Telehealth: Payer: Self-pay | Admitting: Family Medicine

## 2013-10-02 NOTE — Telephone Encounter (Signed)
Called Diana Ho from [facility] and she stated that pt is needing an order faxed to [facility] for Tylenol for menstrual cramping.

## 2013-10-02 NOTE — Telephone Encounter (Signed)
Patient needs and order for TYELNOL . Please call Darel Hong at [facility].

## 2013-10-02 NOTE — Telephone Encounter (Signed)
Send Ibuprofen 600mg  po Q 6 hours prn pain #60 R 3 Tylenol 500mg  every 6 hours prn pain #60 R 3  Would give her Ibuprofen first

## 2013-10-03 ENCOUNTER — Other Ambulatory Visit (HOSPITAL_COMMUNITY): Payer: Self-pay | Admitting: Family Medicine

## 2013-10-03 MED ORDER — IBUPROFEN 600 MG PO TABS: 600.0000 mg | ORAL_TABLET | Freq: Four times a day (QID) | ORAL | Status: AC | PRN

## 2013-10-03 MED ORDER — ACETAMINOPHEN 500 MG PO TABS: 500.0000 mg | ORAL_TABLET | Freq: Four times a day (QID) | ORAL | Status: AC | PRN

## 2013-10-03 NOTE — Telephone Encounter (Signed)
Medication refilled per protocol. 

## 2013-10-03 NOTE — Telephone Encounter (Signed)
Meds refilled.

## 2013-11-24 ENCOUNTER — Ambulatory Visit: Payer: Medicaid Other | Admitting: Family Medicine

## 2013-11-25 ENCOUNTER — Ambulatory Visit: Payer: Medicaid Other | Admitting: Family Medicine

## 2013-12-03 ENCOUNTER — Encounter: Payer: Self-pay | Admitting: Family Medicine

## 2013-12-03 ENCOUNTER — Ambulatory Visit (INDEPENDENT_AMBULATORY_CARE_PROVIDER_SITE_OTHER): Payer: Medicaid Other | Admitting: Family Medicine

## 2013-12-03 VITALS — BP 108/60 | HR 80 | Temp 97.9°F | Resp 18 | Ht 62.0 in | Wt 150.5 lb

## 2013-12-03 DIAGNOSIS — K219 Gastro-esophageal reflux disease without esophagitis: Secondary | ICD-10-CM

## 2013-12-03 DIAGNOSIS — Z23 Encounter for immunization: Secondary | ICD-10-CM

## 2013-12-03 DIAGNOSIS — F319 Bipolar disorder, unspecified: Secondary | ICD-10-CM

## 2013-12-03 DIAGNOSIS — F79 Unspecified intellectual disabilities: Secondary | ICD-10-CM

## 2013-12-03 DIAGNOSIS — R300 Dysuria: Secondary | ICD-10-CM

## 2013-12-03 DIAGNOSIS — Z79899 Other long term (current) drug therapy: Secondary | ICD-10-CM

## 2013-12-03 DIAGNOSIS — E663 Overweight: Secondary | ICD-10-CM

## 2013-12-03 LAB — CBC WITH DIFFERENTIAL/PLATELET
Basophils Absolute: 0 10*3/uL (ref 0.0–0.1)
Basophils Relative: 0 % (ref 0–1)
Eosinophils Absolute: 0 10*3/uL (ref 0.0–0.7)
Eosinophils Relative: 0 % (ref 0–5)
HCT: 42.6 % (ref 36.0–46.0)
Hemoglobin: 14.4 g/dL (ref 12.0–15.0)
Lymphocytes Relative: 35 % (ref 12–46)
Lymphs Abs: 3.3 10*3/uL (ref 0.7–4.0)
MCH: 30 pg (ref 26.0–34.0)
MCHC: 33.8 g/dL (ref 30.0–36.0)
MCV: 88.8 fL (ref 78.0–100.0)
Monocytes Absolute: 0.9 10*3/uL (ref 0.1–1.0)
Monocytes Relative: 10 % (ref 3–12)
Neutro Abs: 5.1 10*3/uL (ref 1.7–7.7)
Neutrophils Relative %: 55 % (ref 43–77)
Platelets: 252 10*3/uL (ref 150–400)
RBC: 4.8 MIL/uL (ref 3.87–5.11)
RDW: 14.3 % (ref 11.5–15.5)
WBC: 9.3 10*3/uL (ref 4.0–10.5)

## 2013-12-03 LAB — COMPREHENSIVE METABOLIC PANEL
Albumin: 4.5 g/dL (ref 3.5–5.2)
CO2: 28 mEq/L (ref 19–32)
Calcium: 9.4 mg/dL (ref 8.4–10.5)
Glucose, Bld: 70 mg/dL (ref 70–99)
Potassium: 4 mEq/L (ref 3.5–5.3)
Sodium: 140 mEq/L (ref 135–145)
Total Protein: 6.9 g/dL (ref 6.0–8.3)

## 2013-12-03 NOTE — Patient Instructions (Signed)
F/u 6 months Continue current meds Schedule visit for PPD placement with Nurse for next week Flu shot given

## 2013-12-03 NOTE — Assessment & Plan Note (Signed)
Followed by psychiatry 

## 2013-12-03 NOTE — Assessment & Plan Note (Signed)
Currently in a new group home. She will return next week for her second part of her PPD Flu shot given

## 2013-12-03 NOTE — Assessment & Plan Note (Signed)
Doing well on Prilosec.

## 2013-12-03 NOTE — Assessment & Plan Note (Signed)
Patient had not complained prior to the visit. Her urinalysis was normal. No treatment needed

## 2013-12-03 NOTE — Progress Notes (Signed)
   Subjective:    Patient ID: Diana Ho, female    DOB: [birth year], 32 y.o.   MRN: [id number]  HPI  Patient here to follow chronic medical problems. She's currently in a new group home called [facility] . They have no specific concerns. She's doing well with her medications. She did recently see GYN and had nexplanon put in for contraception. She was also seen by her psychiatrist and Cogentin was added back secondary to some tremor with her medications. She does complain today of some pain with urination however she is never mentioned this to the facility.  Review of Systems  GEN- denies fatigue, fever, weight loss,weakness, recent illness HEENT- denies eye drainage, change in vision, nasal discharge, CVS- denies chest pain, palpitations RESP- denies SOB, cough, wheeze ABD- denies N/V, change in stools, abd pain GU-?dysuria, denies hematuria, dribbling, incontinence MSK- denies joint pain, muscle aches, injury Neuro- denies headache, dizziness, syncope, seizure activity      Objective:   Physical Exam GEN- NAD, alert and oriented x3 HEENT- PERRL, EOMI, non injected sclera, pink conjunctiva, MMM, oropharynx clear Neck- Supple, no thyromegaly CVS- RRR, no murmur RESP-CTAB ABD-NABS,soft,NT,ND, no CVA tenderness EXT- No edema Pulses- Radial, DP- 2+        Assessment & Plan:

## 2013-12-03 NOTE — Assessment & Plan Note (Signed)
Metabolic panel and CBC to be done today

## 2013-12-03 NOTE — Assessment & Plan Note (Signed)
She has lost 10 pounds since her last visit 6 months ago. Continue to watch her diet

## 2013-12-05 LAB — URINALYSIS, ROUTINE W REFLEX MICROSCOPIC
Bilirubin Urine: NEGATIVE
Glucose, UA: NEGATIVE mg/dL
Hgb urine dipstick: NEGATIVE
Ketones, ur: NEGATIVE mg/dL
Leukocytes, UA: NEGATIVE
Nitrite: NEGATIVE
Protein, ur: NEGATIVE mg/dL
Specific Gravity, Urine: 1.01 (ref 1.005–1.030)
Urobilinogen, UA: 0.2 mg/dL (ref 0.0–1.0)
pH: 6 (ref 5.0–8.0)

## 2014-01-07 ENCOUNTER — Ambulatory Visit: Payer: Medicaid Other | Admitting: Family Medicine

## 2014-01-08 ENCOUNTER — Other Ambulatory Visit: Payer: Self-pay | Admitting: Physician Assistant

## 2014-01-08 ENCOUNTER — Other Ambulatory Visit: Payer: Self-pay | Admitting: Family Medicine

## 2014-01-08 ENCOUNTER — Encounter: Payer: Self-pay | Admitting: Physician Assistant

## 2014-01-08 ENCOUNTER — Ambulatory Visit (INDEPENDENT_AMBULATORY_CARE_PROVIDER_SITE_OTHER): Payer: Medicaid Other | Admitting: Physician Assistant

## 2014-01-08 VITALS — BP 112/70 | HR 68 | Temp 98.0°F | Resp 18 | Ht 62.25 in | Wt 153.0 lb

## 2014-01-08 DIAGNOSIS — N760 Acute vaginitis: Secondary | ICD-10-CM

## 2014-01-08 DIAGNOSIS — Z7251 High risk heterosexual behavior: Secondary | ICD-10-CM

## 2014-01-08 DIAGNOSIS — B9689 Other specified bacterial agents as the cause of diseases classified elsewhere: Secondary | ICD-10-CM

## 2014-01-08 DIAGNOSIS — F79 Unspecified intellectual disabilities: Secondary | ICD-10-CM

## 2014-01-08 DIAGNOSIS — R300 Dysuria: Secondary | ICD-10-CM

## 2014-01-08 DIAGNOSIS — A499 Bacterial infection, unspecified: Secondary | ICD-10-CM

## 2014-01-08 LAB — URINALYSIS, ROUTINE W REFLEX MICROSCOPIC
BILIRUBIN URINE: NEGATIVE
GLUCOSE, UA: NEGATIVE mg/dL
HGB URINE DIPSTICK: NEGATIVE
KETONES UR: NEGATIVE mg/dL
Leukocytes, UA: NEGATIVE
NITRITE: NEGATIVE
PH: 7 (ref 5.0–8.0)
Protein, ur: NEGATIVE mg/dL
SPECIFIC GRAVITY, URINE: 1.01 (ref 1.005–1.030)
Urobilinogen, UA: 0.2 mg/dL (ref 0.0–1.0)

## 2014-01-08 LAB — WET PREP FOR TRICH, YEAST, CLUE
TRICH WET PREP: NONE SEEN
Yeast Wet Prep HPF POC: NONE SEEN

## 2014-01-08 MED ORDER — METRONIDAZOLE 500 MG PO TABS: 500.0000 mg | ORAL_TABLET | Freq: Two times a day (BID) | ORAL | Status: DC

## 2014-01-08 NOTE — Progress Notes (Signed)
Patient ID: Diana Ho MRN: [id number], DOB: 04/08/1981, 33 y.o. Date of Encounter: @DATE @  Chief Complaint:  Chief Complaint  Patient presents with  . c/o burning with urination    vs vaginal irriattion    HPI: 33 y.o. year old white female  presents with staff from the group home where she resides.   She reports that she first noticed the symptoms yesterday. She went she went to urinate she noticed a burning sensation in her vaginal area. With further questioning, she states that even when she is not urinating there is a burning sensation in the vaginal area. She states that she has not noticed him any increased amount of vaginal discharge. Has had no fevers or chills. No abdominal or pelvic pain.   Past Medical History  Diagnosis Date  . Bipolar 1 disorder   . Renal disorder   . Autistic disorder   . Schizophrenia   . Cervical atypia   . Kidney stones   . Asthma   . GERD (gastroesophageal reflux disease)      Home Meds: See attached medication section for current medication list. Any medications entered into computer today will not appear on this note's list. The medications listed below were entered prior to today. Current Outpatient Prescriptions on File Prior to Visit  Medication Sig Dispense Refill  . acetaminophen (TYLENOL) 500 MG tablet Take 1 tablet (500 mg total) by mouth every 6 (six) hours as needed for pain.  60 tablet  3  . albuterol (PROVENTIL HFA;VENTOLIN HFA) 108 (90 BASE) MCG/ACT inhaler Inhale into the lungs every 4 (four) hours as needed for wheezing or shortness of breath.      . ARIPiprazole (ABILIFY) 20 MG tablet Take 1 tablet (20 mg total) by mouth at bedtime.  30 tablet  1  . benztropine (COGENTIN) 0.5 MG tablet Take 0.5 mg by mouth 2 (two) times daily.      . calcium carbonate (OS-CAL) 600 MG TABS tablet 1 po qd  30 tablet  11  . clonazePAM (KLONOPIN) 0.5 MG tablet Take 0.5 mg by mouth 3 (three) times daily as needed. For nerves      .  divalproex (DEPAKOTE ER) 500 MG 24 hr tablet Take by mouth daily.      Marland Kitchen etonogestrel (NEXPLANON) 68 MG IMPL implant Inject 1 each into the skin once. Placed nov 2014      . ibuprofen (ADVIL,MOTRIN) 600 MG tablet Take 1 tablet (600 mg total) by mouth every 6 (six) hours as needed for pain.  60 tablet  3   No current facility-administered medications on file prior to visit.    Allergies: No Known Allergies  History   Social History  . Marital Status: Single    Spouse Name: N/A    Number of Children: 0  . Years of Education: N/A   Occupational History  . disabled    Social History Main Topics  . Smoking status: Former Smoker    Types: Cigarettes    Quit date: 03/13/2009  . Smokeless tobacco: Never Used     Comment: Quit x 2 years  . Alcohol Use: No     Comment: previous drinker  . Drug Use: No  . Sexual Activity: Not Currently    Birth Control/ Protection: None     Comment: multiple partners in the past   Other Topics Concern  . Not on file   Social History Narrative   Lives w/ father & step-mother  Family History  Problem Relation Age of Onset  . Diabetes Mother   . COPD Mother   . Hypertension Mother   . Liver cancer Maternal Grandfather     etoh  . Anesthesia problems Neg Hx   . Hypotension Neg Hx   . Malignant hyperthermia Neg Hx   . Pseudochol deficiency Neg Hx      Review of Systems:  See HPI for pertinent ROS. All other ROS negative.    Physical Exam: Blood pressure 112/70, pulse 68, temperature 98 F (36.7 C), temperature source Oral, resp. rate 18, height 5' 2.25" (1.581 m), weight 153 lb (69.4 kg)., Body mass index is 27.76 kg/(m^2). General: WNWD WF. Appears in no acute distress. Lungs: Clear bilaterally to auscultation without wheezes, rales, or rhonchi. Breathing is unlabored. Heart: RRR with S1 S2. No murmurs, rubs, or gallops. Abdomen: Soft, non-tender, non-distended with normoactive bowel sounds. No hepatomegaly. No rebound/guarding. No  obvious abdominal masses. Pelvic exam: External genitalia is normal. Vaginal mucosa is normal. Cervix is normal. There is a large amount of yellow-green mucus-like discharge. Bimanual exam is normal with no cervical motion tenderness and no mass. Musculoskeletal:  Strength and tone normal for age. Extremities/Skin: Warm and dry. Neuro:  Moves all extremities spontaneously. Psych:  Responds to questions in very quiet voice but is able to respond to questions.    Results for orders placed in visit on 01/08/14  WET PREP FOR TRICH, YEAST, CLUE      Result Value Range   Yeast Wet Prep HPF POC NONE SEEN  NONE SEEN   Trich, Wet Prep NONE SEEN  NONE SEEN   Clue Cells Wet Prep HPF POC FEW (*) NONE SEEN   WBC, Wet Prep HPF POC FEW  NONE SEEN  URINALYSIS, ROUTINE W REFLEX MICROSCOPIC      Result Value Range   Color, Urine YELLOW  YELLOW   APPearance CLEAR  CLEAR   Specific Gravity, Urine 1.010  1.005 - 1.030   pH 7.0  5.0 - 8.0   Glucose, UA NEG  NEG mg/dL   Bilirubin Urine NEG  NEG   Ketones, ur NEG  NEG mg/dL   Hgb urine dipstick NEG  NEG   Protein, ur NEG  NEG mg/dL   Urobilinogen, UA 0.2  0.0 - 1.0 mg/dL   Nitrite NEG  NEG   Leukocytes, UA NEG  NEG     ASSESSMENT AND PLAN:  33 y.o. year old female with  1. Burning with urination - Urinalysis, Routine w reflex microscopic  2. Mental retardation  3. In Epic-Problem List includes: H/O High risk sexual behavior  4. Vaginitis and vulvovaginitis - GC/chlamydia probe amp, genital - WET PREP FOR TRICH, YEAST, CLUE  5. bacterial vaginosis Flagyl 500 mg 1 by mouth twice a day x7 days #14 with 0 refill   Signed, [address] River AvenueMary Beth Savage TownDixon, GeorgiaPA, [facility] 01/08/2014 10:53 AM

## 2014-01-09 LAB — GC/CHLAMYDIA PROBE AMP
CT Probe RNA: NEGATIVE
GC PROBE AMP APTIMA: NEGATIVE

## 2014-01-30 ENCOUNTER — Other Ambulatory Visit: Payer: Self-pay | Admitting: *Deleted

## 2014-01-30 MED ORDER — OMEPRAZOLE 20 MG PO CPDR: DELAYED_RELEASE_CAPSULE | ORAL | Status: DC

## 2014-01-30 NOTE — Telephone Encounter (Signed)
Refill appropriate and filled per protocol. 

## 2014-02-24 ENCOUNTER — Other Ambulatory Visit: Payer: Self-pay | Admitting: *Deleted

## 2014-02-24 MED ORDER — ALBUTEROL SULFATE HFA 108 (90 BASE) MCG/ACT IN AERS: 2.0000 | INHALATION_SPRAY | RESPIRATORY_TRACT | Status: DC | PRN

## 2014-02-24 NOTE — Telephone Encounter (Signed)
Refill appropriate and filled per protocol. 

## 2014-05-28 ENCOUNTER — Other Ambulatory Visit: Payer: Self-pay | Admitting: Family Medicine

## 2014-05-28 NOTE — Telephone Encounter (Signed)
Prescription sent to pharmacy.

## 2014-06-22 ENCOUNTER — Telehealth: Payer: Self-pay | Admitting: Family Medicine

## 2014-06-22 NOTE — Telephone Encounter (Signed)
Shot record printed for collection

## 2014-09-02 ENCOUNTER — Other Ambulatory Visit: Payer: Medicaid Other | Admitting: Obstetrics and Gynecology

## 2014-09-04 ENCOUNTER — Telehealth: Payer: Self-pay | Admitting: Family Medicine

## 2014-09-04 NOTE — Telephone Encounter (Signed)
I dont have any allergies on file and have not seen her since Feb So they can cancel them out

## 2014-09-04 NOTE — Telephone Encounter (Signed)
[facility]  That they have a history of her being allergic Acetaminophen and Ibuprofen.  They need this verified or canceled out per you order.  Please advise.

## 2014-09-04 NOTE — Telephone Encounter (Signed)
I called care center back and informed them, per provider, patient has no known allergies.

## 2014-09-14 ENCOUNTER — Other Ambulatory Visit: Payer: Medicaid Other | Admitting: Obstetrics and Gynecology

## 2014-09-14 ENCOUNTER — Encounter: Payer: Self-pay | Admitting: Obstetrics and Gynecology

## 2014-09-23 ENCOUNTER — Other Ambulatory Visit: Payer: Medicaid Other | Admitting: Obstetrics and Gynecology

## 2014-09-24 ENCOUNTER — Other Ambulatory Visit: Payer: Medicaid Other | Admitting: Obstetrics and Gynecology

## 2014-09-29 ENCOUNTER — Ambulatory Visit (INDEPENDENT_AMBULATORY_CARE_PROVIDER_SITE_OTHER): Payer: Medicaid Other | Admitting: Obstetrics and Gynecology

## 2014-09-29 ENCOUNTER — Encounter: Payer: Self-pay | Admitting: Obstetrics and Gynecology

## 2014-09-29 VITALS — BP 110/66 | Ht 63.0 in | Wt 145.0 lb

## 2014-09-29 DIAGNOSIS — Z7251 High risk heterosexual behavior: Secondary | ICD-10-CM

## 2014-09-29 DIAGNOSIS — Z0000 Encounter for general adult medical examination without abnormal findings: Secondary | ICD-10-CM

## 2014-09-29 DIAGNOSIS — Z01419 Encounter for gynecological examination (general) (routine) without abnormal findings: Secondary | ICD-10-CM

## 2014-09-29 NOTE — Progress Notes (Signed)
Patient ID: Diana Ho, female   DOB: Apr 21, 1981, 33 y.o.   MRN: [id number] Pt here today for annual exam. Pt denies any problems at this time.  3 yr Assessment:  Annual Gyn Exam  Pap not done today, normal pap with negative HPV 09/01/13.  Plan:  1. pap smear done, next pap due 3 yr 2. return annually or prn 3    Annual mammogram advised Subjective:  Diana Ho is a 33 y.o. female G0P0 who presents for annual exam. No LMP recorded. Patient has had an implant.nexplanon The patient has complaints today of none.  The following portions of the patient's history were reviewed and updated as appropriate: allergies, current medications, past family history, past medical history, past social history, past surgical history and problem list. Past Medical History  Diagnosis Date  . Bipolar 1 disorder   . Renal disorder   . Autistic disorder   . Schizophrenia   . Cervical atypia   . Kidney stones   . Asthma   . GERD (gastroesophageal reflux disease)     Past Surgical History  Procedure Laterality Date  . Foot surgery      as baby  . Kidney stone surgery    . Savory dilation  03/19/2012    Procedure: SAVORY DILATION;  Surgeon: West BaliSandi L Fields, MD;  Location: [facility];  Service: Endoscopy;  Laterality: N/A;  . Esophageal biopsy  03/19/2012    Procedure: BIOPSY;  Surgeon: West BaliSandi L Fields, MD;  Location: [facility];  Service: Endoscopy;  Laterality: N/A;  gastric and esophageal biospies    Current outpatient prescriptions:acetaminophen (TYLENOL) 500 MG tablet, Take 1 tablet (500 mg total) by mouth every 6 (six) hours as needed for pain., Disp: 60 tablet, Rfl: 3;  albuterol (PROVENTIL HFA;VENTOLIN HFA) 108 (90 BASE) MCG/ACT inhaler, Inhale 2 puffs into the lungs every 4 (four) hours as needed for wheezing or shortness of breath., Disp: 18 g, Rfl: 3 ARIPiprazole (ABILIFY) 20 MG tablet, Take 1 tablet (20 mg total) by mouth at bedtime., Disp: 30 tablet, Rfl: 1;  benztropine (COGENTIN) 0.5 MG tablet,  Take 0.5 mg by mouth 2 (two) times daily., Disp: , Rfl: ;  calcium carbonate (OS-CAL) 600 MG TABS tablet, 1 po qd, Disp: 30 tablet, Rfl: 11;  clonazePAM (KLONOPIN) 0.5 MG tablet, Take 0.5 mg by mouth 3 (three) times daily as needed. For nerves, Disp: , Rfl:  divalproex (DEPAKOTE ER) 500 MG 24 hr tablet, Take by mouth daily., Disp: , Rfl: ;  etonogestrel (NEXPLANON) 68 MG IMPL implant, Inject 1 each into the skin once. Placed nov 2014, Disp: , Rfl: ;  ibuprofen (ADVIL,MOTRIN) 600 MG tablet, Take 1 tablet (600 mg total) by mouth every 6 (six) hours as needed for pain., Disp: 60 tablet, Rfl: 3;  omeprazole (PRILOSEC) 20 MG capsule, TAKE (1) CAPSULE BY MOUTH TWICE DAILY., Disp: 60 capsule, Rfl: 6  Review of Systems Constitutional: negative wt gain  Gastrointestinal: negative Genitourinary: no menses. On Nexplanon.  Objective:  BP 110/66  Ht 5\' 3"  (1.6 m)  Wt 145 lb (65.772 kg)  BMI 25.69 kg/m2   BMI: Body mass index is 25.69 kg/(m^2).  General Appearance: Alert, appropriate appearance for age. No acute distress HEENT: Grossly normal Neck / Thyroid:  Cardiovascular: RRR; normal S1, S2, no murmur Lungs: CTA bilaterally Back: No CVAT Breast Exam: No dimpling, nipple retraction or discharge. No masses or nodes., Normal to inspection, Normal breast tissue bilaterally and No masses or nodes.No dimpling, nipple retraction or  discharge. Gastrointestinal: Soft, non-tender, no masses or organomegaly Pelvic Exam: Vulva and vagina appear normal. Bimanual exam reveals normal uterus and adnexa. Rectovaginal: not indicated Lymphatic Exam: Non-palpable nodes in neck, clavicular, axillary, or inguinal regions Skin: no rash or abnormalities Neurologic: Normal gait and speech, no tremor  Psychiatric: Alert and oriented, appropriate affect.  Urinalysis:Not done  Christin BachJohn Sherel Fennell. MD Pgr [phone] 3:47 PM Chaperone : Willy EddyAshley Travis.

## 2014-10-01 ENCOUNTER — Other Ambulatory Visit (HOSPITAL_COMMUNITY): Payer: Self-pay | Admitting: Family Medicine

## 2014-10-01 NOTE — Telephone Encounter (Signed)
Medication filled x1 with no refills.   Requires office visit before any further refills can be given.   Letter sent.  

## 2014-10-13 ENCOUNTER — Ambulatory Visit (INDEPENDENT_AMBULATORY_CARE_PROVIDER_SITE_OTHER): Payer: Medicaid Other | Admitting: Family Medicine

## 2014-10-13 ENCOUNTER — Encounter: Payer: Self-pay | Admitting: Family Medicine

## 2014-10-13 VITALS — BP 112/68 | HR 78 | Temp 98.2°F | Resp 14 | Ht 61.0 in | Wt 144.0 lb

## 2014-10-13 DIAGNOSIS — F79 Unspecified intellectual disabilities: Secondary | ICD-10-CM

## 2014-10-13 DIAGNOSIS — F319 Bipolar disorder, unspecified: Secondary | ICD-10-CM

## 2014-10-13 DIAGNOSIS — Z79899 Other long term (current) drug therapy: Secondary | ICD-10-CM

## 2014-10-13 DIAGNOSIS — Z23 Encounter for immunization: Secondary | ICD-10-CM

## 2014-10-13 DIAGNOSIS — E663 Overweight: Secondary | ICD-10-CM

## 2014-10-13 DIAGNOSIS — K219 Gastro-esophageal reflux disease without esophagitis: Secondary | ICD-10-CM

## 2014-10-13 LAB — CBC WITH DIFFERENTIAL/PLATELET
BASOS ABS: 0 10*3/uL (ref 0.0–0.1)
BASOS PCT: 0 % (ref 0–1)
EOS PCT: 1 % (ref 0–5)
Eosinophils Absolute: 0.1 10*3/uL (ref 0.0–0.7)
HEMATOCRIT: 42 % (ref 36.0–46.0)
Hemoglobin: 14.3 g/dL (ref 12.0–15.0)
LYMPHS PCT: 38 % (ref 12–46)
Lymphs Abs: 3.2 10*3/uL (ref 0.7–4.0)
MCH: 30.4 pg (ref 26.0–34.0)
MCHC: 34 g/dL (ref 30.0–36.0)
MCV: 89.4 fL (ref 78.0–100.0)
MONO ABS: 0.8 10*3/uL (ref 0.1–1.0)
Monocytes Relative: 9 % (ref 3–12)
NEUTROS ABS: 4.4 10*3/uL (ref 1.7–7.7)
Neutrophils Relative %: 52 % (ref 43–77)
PLATELETS: 224 10*3/uL (ref 150–400)
RBC: 4.7 MIL/uL (ref 3.87–5.11)
RDW: 14.5 % (ref 11.5–15.5)
WBC: 8.5 10*3/uL (ref 4.0–10.5)

## 2014-10-13 LAB — COMPREHENSIVE METABOLIC PANEL
ALK PHOS: 56 U/L (ref 39–117)
ALT: 14 U/L (ref 0–35)
AST: 12 U/L (ref 0–37)
Albumin: 4.5 g/dL (ref 3.5–5.2)
BUN: 9 mg/dL (ref 6–23)
CHLORIDE: 104 meq/L (ref 96–112)
CO2: 27 mEq/L (ref 19–32)
CREATININE: 0.8 mg/dL (ref 0.50–1.10)
Calcium: 9.5 mg/dL (ref 8.4–10.5)
Glucose, Bld: 74 mg/dL (ref 70–99)
Potassium: 4.4 mEq/L (ref 3.5–5.3)
Sodium: 139 mEq/L (ref 135–145)
Total Bilirubin: 0.4 mg/dL (ref 0.2–1.2)
Total Protein: 6.9 g/dL (ref 6.0–8.3)

## 2014-10-13 LAB — TSH: TSH: 3.91 u[IU]/mL (ref 0.350–4.500)

## 2014-10-13 MED ORDER — ALBUTEROL SULFATE HFA 108 (90 BASE) MCG/ACT IN AERS: 2.0000 | INHALATION_SPRAY | RESPIRATORY_TRACT | Status: DC | PRN

## 2014-10-13 MED ORDER — OMEPRAZOLE 20 MG PO CPDR: DELAYED_RELEASE_CAPSULE | ORAL | Status: DC

## 2014-10-13 NOTE — Progress Notes (Signed)
Patient ID: Diana Ho, female   DOB: 1981-02-04, 33 y.o.   MRN: [id number]   Subjective:    Patient ID: Diana Ho, female    DOB: 1981-02-04, 33 y.o.   MRN: [id number]  Patient presents for Medication Review/ Refills patient here for medication review. [facility] They've no specific concerns regarding her behavior. She has not had any recent illnesses. She's being followed [facility] there've been no changes. She's also is being followed by GYN she has next blood non-for birth control. Her flu shot has been done    Review Of Systems:  GEN- denies fatigue, fever, weight loss,weakness, recent illness HEENT- denies eye drainage, change in vision, nasal discharge, CVS- denies chest pain, palpitations RESP- denies SOB, cough, wheeze ABD- denies N/V, change in stools, abd pain GU- denies dysuria, hematuria, dribbling, incontinence MSK- denies joint pain, muscle aches, injury Neuro- denies headache, dizziness, syncope, seizure activity       Objective:    BP 112/68 mmHg  Pulse 78  Temp(Src) 98.2 F (36.8 C) (Oral)  Resp 14  Ht 5\' 1"  (1.549 m)  Wt 144 lb (65.318 kg)  BMI 27.22 kg/m2 GEN- NAD, alert and oriented x3  mental retardation HEENT- PERRL, EOMI, non injected sclera, pink conjunctiva, MMM, oropharynx clear Neck- Supple, no thyromegaly CVS- RRR, no murmur RESP-CTAB ABD-NABS,soft,NT,ND EXT- No edema Pulses- Radial 2+        Assessment & Plan:      Problem List Items Addressed This Visit    None    Visit Diagnoses    Long-term use of high-risk medication    -  Primary    Relevant Orders       CBC with Differential       Comprehensive metabolic panel       TSH       Note: This dictation was prepared with Dragon dictation along with smaller phrase technology. Any transcriptional errors that result from this process are unintentional.

## 2014-10-13 NOTE — Patient Instructions (Signed)
Continue current medications  We will call with lab results  F/U 1 year or as needed

## 2014-10-13 NOTE — Addendum Note (Signed)
Addended by: Phillips OdorSIX, CHRISTINA H on: 10/13/2014 05:10 PM   Modules accepted: Orders

## 2014-10-13 NOTE — Assessment & Plan Note (Signed)
Stable on PPI 

## 2014-10-13 NOTE — Assessment & Plan Note (Signed)
Followed by psychiatry, stable on medications 

## 2014-11-29 ENCOUNTER — Other Ambulatory Visit: Payer: Self-pay | Admitting: Family Medicine

## 2014-11-29 NOTE — Telephone Encounter (Signed)
Refill appropriate and filled per protocol. 

## 2015-01-01 ENCOUNTER — Other Ambulatory Visit: Payer: Self-pay | Admitting: Physician Assistant

## 2015-07-29 ENCOUNTER — Other Ambulatory Visit: Payer: Self-pay | Admitting: Family Medicine

## 2015-07-29 NOTE — Telephone Encounter (Signed)
Refill appropriate and filled per protocol. 

## 2015-11-12 ENCOUNTER — Encounter: Payer: Self-pay | Admitting: Obstetrics and Gynecology

## 2015-11-12 ENCOUNTER — Ambulatory Visit (INDEPENDENT_AMBULATORY_CARE_PROVIDER_SITE_OTHER): Payer: Medicaid Other | Admitting: Obstetrics and Gynecology

## 2015-11-12 VITALS — BP 120/70 | Ht 62.0 in | Wt 138.0 lb

## 2015-11-12 DIAGNOSIS — F79 Unspecified intellectual disabilities: Secondary | ICD-10-CM

## 2015-11-12 DIAGNOSIS — Z113 Encounter for screening for infections with a predominantly sexual mode of transmission: Secondary | ICD-10-CM

## 2015-11-12 DIAGNOSIS — B3731 Acute candidiasis of vulva and vagina: Secondary | ICD-10-CM | POA: Insufficient documentation

## 2015-11-12 DIAGNOSIS — B373 Candidiasis of vulva and vagina: Secondary | ICD-10-CM | POA: Insufficient documentation

## 2015-11-12 LAB — POCT WET PREP WITH KOH
Trichomonas, UA: NEGATIVE
YEAST WET PREP PER HPF POC: POSITIVE

## 2015-11-12 MED ORDER — MICONAZOLE NITRATE 2 % VA CREA: 0.5000 | TOPICAL_CREAM | Freq: Every day | VAGINAL | Status: DC

## 2015-11-12 MED ORDER — FLUCONAZOLE 150 MG PO TABS: 150.0000 mg | ORAL_TABLET | Freq: Once | ORAL | Status: DC

## 2015-11-12 NOTE — Progress Notes (Signed)
Patient ID: Diana Ho, female   DOB: 1980/12/25, 34 y.o.   MRN: [id number]   [facility]  Patient name: Diana Ho MRN [id number]  Date of birth: 1980/12/25  CC & HPI:  Diana Ho is a 34 y.o. female G0P0, [facility], presenting today for constant, moderate vaginal itching that started 2 days ago. She reports lower abdominal pain as an associated symptom. Pt had Implanon placed in 09/2013. She does not have menses.   ROS:  A complete 10 system review of systems was obtained and all systems are negative except as noted in the HPI and PMH.   Pertinent History Reviewed:   Reviewed. Medical         Past Medical History  Diagnosis Date  . Bipolar 1 disorder (HCC)   . Renal disorder   . Autistic disorder   . Schizophrenia (HCC)   . Cervical atypia   . Kidney stones   . Asthma   . GERD (gastroesophageal reflux disease)                               Surgical Hx:    Past Surgical History  Procedure Laterality Date  . Foot surgery      as baby  . Kidney stone surgery    . Savory dilation  03/19/2012    Procedure: SAVORY DILATION;  Surgeon: West BaliSandi L Fields, MD;  Location: [facility];  Service: Endoscopy;  Laterality: N/A;  . Esophageal biopsy  03/19/2012    Procedure: BIOPSY;  Surgeon: West BaliSandi L Fields, MD;  Location: [facility];  Service: Endoscopy;  Laterality: N/A;  gastric and esophageal biospies   Medications: Reviewed & Updated - see associated section                       Current outpatient prescriptions:  .  acetaminophen (TYLENOL) 500 MG tablet, Take 1 tablet (500 mg total) by mouth every 6 (six) hours as needed for pain., Disp: 60 tablet, Rfl: 3 .  albuterol (PROVENTIL HFA;VENTOLIN HFA) 108 (90 BASE) MCG/ACT inhaler, Inhale 2 puffs into the lungs every 4 (four) hours as needed for wheezing or shortness of breath., Disp: 18 g, Rfl: 3 .  ARIPiprazole (ABILIFY) 20 MG tablet, Take 1 tablet (20 mg total) by mouth at bedtime., Disp: 30  tablet, Rfl: 1 .  benztropine (COGENTIN) 0.5 MG tablet, Take 0.5 mg by mouth 2 (two) times daily., Disp: , Rfl:  .  calcium carbonate (OS-CAL) 600 MG TABS tablet, TAKE ONE TABLET BY MOUTH ONCE DAILY., Disp: 30 tablet, Rfl: 11 .  clonazePAM (KLONOPIN) 0.5 MG tablet, Take 0.5 mg by mouth 3 (three) times daily as needed. For nerves, Disp: , Rfl:  .  divalproex (DEPAKOTE ER) 500 MG 24 hr tablet, Take by mouth daily., Disp: , Rfl:  .  etonogestrel (NEXPLANON) 68 MG IMPL implant, Inject 1 each into the skin once. Placed nov 2014, Disp: , Rfl:  .  ibuprofen (ADVIL,MOTRIN) 600 MG tablet, Take 1 tablet (600 mg total) by mouth every 6 (six) hours as needed for pain., Disp: 60 tablet, Rfl: 3 .  omeprazole (PRILOSEC) 20 MG capsule, TAKE (1) CAPSULE BY MOUTH TWICE DAILY., Disp: 60 capsule, Rfl: 11   Social History: Reviewed -  reports that she quit smoking about 6 years ago. Her smoking use included Cigarettes. She has never used smokeless tobacco.  Objective Findings:  Vitals: Blood pressure 120/70, height  (1.575 m), weight 138 lb (62.596 kg).  Physical Examination: General appearance - alert, well appearing, and in no distress and oriented to person, place, and time Mental status - alert, oriented to person, place, and time Pelvic - normal external genitalia, vulva, vagina, cervix, uterus and adnexa VULVA: normal appearing vulva with no masses, tenderness or lesions VAGINA: normal appearing vagina with normal color, no lesions, generous white vaginal discharge CERVIX: normal appearing cervix without discharge or lesions UTERUS: uterus is normal size, shape, consistency and nontender ADNEXA: normal adnexa in size, nontender and no masses   Assessment & Plan:   A:  1. Vulvar irritation, mild yeast infection  P:  Rx Diflucan 150 mg tab q 3 d x 2      Rx Monistat 7 topical prn, and per vagina x 2.days 1. Pap smear and co-testing in 2017 2. Nexplanon replacement in 2017   By signing my name  below, I, Gwenyth Ober, attest that this documentation has been prepared under the direction and in the presence of Tilda Burrow, MD. Electronically Signed: Gwenyth Ober, ED Scribe. 11/12/2015. 9:09 AM.    I personally performed the services described in this documentation, which was SCRIBED in my presence. The recorded information has been reviewed and considered accurate. It has been edited as necessary during review. Tilda Burrow, MD

## 2015-11-12 NOTE — Progress Notes (Signed)
Patient ID: Diana Ho, female   DOB: [birth year], 34 y.o.   MRN: [id number] Pt her today for vaginal itching. Pt states that she has had the itching for about 2 weeks.

## 2015-11-16 LAB — GC/CHLAMYDIA PROBE AMP
CHLAMYDIA, DNA PROBE: NEGATIVE
NEISSERIA GONORRHOEAE BY PCR: NEGATIVE

## 2015-12-07 ENCOUNTER — Encounter (HOSPITAL_COMMUNITY): Payer: Self-pay | Admitting: Emergency Medicine

## 2015-12-07 ENCOUNTER — Emergency Department (HOSPITAL_COMMUNITY)
Admission: EM | Admit: 2015-12-07 | Discharge: 2015-12-07 | Disposition: A | Discharge status: Home / Self Care | Payer: Medicaid Other | Attending: Emergency Medicine | Admitting: Emergency Medicine

## 2015-12-07 ENCOUNTER — Emergency Department (HOSPITAL_COMMUNITY): Payer: Medicaid Other

## 2015-12-07 DIAGNOSIS — S299XXA Unspecified injury of thorax, initial encounter: Secondary | ICD-10-CM | POA: Insufficient documentation

## 2015-12-07 DIAGNOSIS — W228XXA Striking against or struck by other objects, initial encounter: Secondary | ICD-10-CM | POA: Diagnosis not present

## 2015-12-07 DIAGNOSIS — J45909 Unspecified asthma, uncomplicated: Secondary | ICD-10-CM | POA: Diagnosis not present

## 2015-12-07 DIAGNOSIS — Y92193 Bedroom in other specified residential institution as the place of occurrence of the external cause: Secondary | ICD-10-CM | POA: Diagnosis not present

## 2015-12-07 DIAGNOSIS — F319 Bipolar disorder, unspecified: Secondary | ICD-10-CM | POA: Diagnosis not present

## 2015-12-07 DIAGNOSIS — Z8742 Personal history of other diseases of the female genital tract: Secondary | ICD-10-CM | POA: Insufficient documentation

## 2015-12-07 DIAGNOSIS — Z87448 Personal history of other diseases of urinary system: Secondary | ICD-10-CM | POA: Insufficient documentation

## 2015-12-07 DIAGNOSIS — K219 Gastro-esophageal reflux disease without esophagitis: Secondary | ICD-10-CM | POA: Diagnosis not present

## 2015-12-07 DIAGNOSIS — Y998 Other external cause status: Secondary | ICD-10-CM | POA: Insufficient documentation

## 2015-12-07 DIAGNOSIS — Z79899 Other long term (current) drug therapy: Secondary | ICD-10-CM | POA: Diagnosis not present

## 2015-12-07 DIAGNOSIS — R0789 Other chest pain: Secondary | ICD-10-CM

## 2015-12-07 DIAGNOSIS — Z87891 Personal history of nicotine dependence: Secondary | ICD-10-CM | POA: Insufficient documentation

## 2015-12-07 DIAGNOSIS — T148XXA Other injury of unspecified body region, initial encounter: Secondary | ICD-10-CM

## 2015-12-07 DIAGNOSIS — T148 Other injury of unspecified body region: Secondary | ICD-10-CM | POA: Diagnosis not present

## 2015-12-07 DIAGNOSIS — F840 Autistic disorder: Secondary | ICD-10-CM | POA: Diagnosis not present

## 2015-12-07 DIAGNOSIS — Y9341 Activity, dancing: Secondary | ICD-10-CM | POA: Diagnosis not present

## 2015-12-07 MED ORDER — IBUPROFEN 400 MG PO TABS
600.0000 mg | ORAL_TABLET | Freq: Once | ORAL | Status: AC
  Administered 2015-12-07: 600 mg via ORAL
  Filled 2015-12-07: qty 2

## 2015-12-07 MED ORDER — IBUPROFEN 600 MG PO TABS: 600.0000 mg | ORAL_TABLET | Freq: Four times a day (QID) | ORAL | Status: DC | PRN

## 2015-12-07 NOTE — ED Notes (Signed)
[facility] contacted [facility]. Supervisor coming to transport pt.

## 2015-12-07 NOTE — ED Provider Notes (Signed)
CSN: [id number]     Arrival date & time 12/07/15  [id number] History  By signing my name below, I, Marica Otterusrat Rahman, attest that this documentation has been prepared under the direction and in the presence of No att. providers found. Electronically Signed: Marica OtterNusrat Rahman, ED Scribe. 12/07/2015. 9:41 AM.  Chief Complaint  Patient presents with  . Rib Injury   The history is provided by the patient. No language interpreter was used.   PCP: Milinda AntisURHAM, KAWANTA, MD HPI Comments: Diana Ho is a 35 y.o. female, with PMHx noted below, who presents to the Emergency Department complaining of right ribcage pain and right chest wall pain onset two weeks ago after pt hit her right ribcage on a dresser while dancing. Pt denies any associated swelling. Pt notes deep breaths and movements aggravates the pain. Pt denies fever, cough, congestion, SOB, abd pain.   Past Medical History  Diagnosis Date  . Bipolar 1 disorder (HCC)   . Renal disorder   . Autistic disorder   . Schizophrenia (HCC)   . Cervical atypia   . Kidney stones   . Asthma   . GERD (gastroesophageal reflux disease)    Past Surgical History  Procedure Laterality Date  . Foot surgery      as baby  . Kidney stone surgery    . Savory dilation  03/19/2012    Procedure: SAVORY DILATION;  Surgeon: West BaliSandi L Fields, MD;  Location: [facility];  Service: Endoscopy;  Laterality: N/A;  . Esophageal biopsy  03/19/2012    Procedure: BIOPSY;  Surgeon: West BaliSandi L Fields, MD;  Location: [facility];  Service: Endoscopy;  Laterality: N/A;  gastric and esophageal biospies   Family History  Problem Relation Age of Onset  . Diabetes Mother   . COPD Mother   . Hypertension Mother   . Liver cancer Maternal Grandfather     etoh  . Anesthesia problems Neg Hx   . Hypotension Neg Hx   . Malignant hyperthermia Neg Hx   . Pseudochol deficiency Neg Hx    Social History  Substance Use Topics  . Smoking status: Former Smoker    Types: Cigarettes    Quit date: 03/13/2009  .  Smokeless tobacco: Never Used     Comment: Quit x 2 years  . Alcohol Use: No     Comment: previous drinker   OB History    Gravida Para Term Preterm AB TAB SAB Ectopic Multiple Living            0     Review of Systems  Constitutional: Negative for fever.  HENT: Negative for congestion.   Respiratory: Negative for cough and shortness of breath.   Gastrointestinal: Negative for abdominal pain.  Musculoskeletal: Positive for arthralgias (chest wall pain and right ribcage pain).  All other systems reviewed and are negative.  Allergies  Review of patient's allergies indicates no known allergies.  Home Medications   Prior to Admission medications   Medication Sig Start Date End Date Taking? Authorizing Provider  acetaminophen (TYLENOL) 500 MG tablet Take 1 tablet (500 mg total) by mouth every 6 (six) hours as needed for pain. 10/03/13  Yes Salley ScarletKawanta F [place], MD  albuterol (PROVENTIL HFA;VENTOLIN HFA) 108 (90 BASE) MCG/ACT inhaler Inhale 2 puffs into the lungs every 4 (four) hours as needed for wheezing or shortness of breath. 10/13/14  Yes Salley ScarletKawanta F [place], MD  ARIPiprazole (ABILIFY) 20 MG tablet Take 1 tablet (20 mg total) by mouth at bedtime. 09/25/13  Yes  Salley Scarlet, MD  benztropine (COGENTIN) 0.5 MG tablet Take 0.5 mg by mouth 2 (two) times daily.   Yes Historical Provider, MD  calcium carbonate (OS-CAL) 600 MG TABS tablet TAKE ONE TABLET BY MOUTH ONCE DAILY. 11/29/14  Yes Salley Scarlet, MD  clonazePAM (KLONOPIN) 0.5 MG tablet Take 0.5 mg by mouth 3 (three) times daily as needed. For nerves   Yes Historical Provider, MD  divalproex (DEPAKOTE ER) 500 MG 24 hr tablet Take by mouth daily.   Yes Historical Provider, MD  etonogestrel (NEXPLANON) 68 MG IMPL implant Inject 1 each into the skin once. Placed nov 2014   Yes Historical Provider, MD  ibuprofen (ADVIL,MOTRIN) 600 MG tablet Take 1 tablet (600 mg total) by mouth every 6 (six) hours as needed for pain. 10/03/13  Yes Salley Scarlet, MD  omeprazole (PRILOSEC) 20 MG capsule TAKE (1) CAPSULE BY MOUTH TWICE DAILY. 07/29/15  Yes Salley Scarlet, MD  ibuprofen (ADVIL,MOTRIN) 600 MG tablet Take 1 tablet (600 mg total) by mouth every 6 (six) hours as needed. 12/07/15   Lavera Guise, MD   Triage Vitals: BP 106/68 mmHg  Pulse 80  Temp(Src) 97.6 F (36.4 C) (Oral)  Resp 18  Ht 5\' 8"  (1.727 m)  Wt 140 lb (63.504 kg)  BMI 21.29 kg/m2  SpO2 100% Physical Exam  Constitutional: She is oriented to person, place, and time. She appears well-developed and well-nourished.  HENT:  Head: Normocephalic.  Eyes: EOM are normal.  Neck: Normal range of motion.  Cardiovascular: Normal rate and regular rhythm.   Pulmonary/Chest: Effort normal and breath sounds normal. No respiratory distress.  Abdominal: She exhibits no distension.  Musculoskeletal: Normal range of motion. She exhibits tenderness.  Tenderness to palpation right upper posterior and anterior ribs (Ribs 2,3,4). No swelling, bruising or deformities noted.   Neurological: She is alert and oriented to person, place, and time.  Psychiatric: She has a normal mood and affect.  Nursing note and vitals reviewed.   ED Course  Procedures (including critical care time) DIAGNOSTIC STUDIES: Oxygen Saturation is 100% on ra, nl by my interpretation.    COORDINATION OF CARE: 9:39 AM: Discussed treatment plan which includes imaging and meds for pain management with pt at bedside; patient verbalizes understanding and agrees with treatment plan.  Imaging Review Dg Ribs Unilateral W/chest Right  12/07/2015  CLINICAL DATA:  Dancing in her bedroom 2 weeks ago, hit right chest on dresser. Hurts to take breath. EXAM: RIGHT RIBS AND CHEST - 3+ VIEW COMPARISON:  12/09/2011 FINDINGS: No fracture or other bone lesions are seen involving the ribs. There is no evidence of pneumothorax or pleural effusion. Both lungs are clear. Heart size and mediastinal contours are within normal limits.  IMPRESSION: Negative. Electronically Signed   By: Charlett Nose M.D.   On: 12/07/2015 10:22   I have personally reviewed and evaluated these images as part of my medical decision-making.  MDM   Final diagnoses:  Chest wall pain  Contusion    35 year old female who presents with right sided chest wall pain after running into her dresser. Well appearing. Stable VS. Reproducible chest wall tenderness with palpation. Lungs clear to auscultation. XR chest/ribs without fractures or contusions. Given motrin with relief of pain. Discussed supportive care treatment. Strict return and follow-up instructions reviewed. She expressed understanding of all discharge instructions and felt comfortable with the plan of care.      I personally performed the services described in this  documentation, which was scribed in my presence. The recorded information has been reviewed and is accurate.    Lavera Guise, MD 12/07/15 [phone]

## 2015-12-07 NOTE — ED Notes (Signed)
Pt dressed and ready to go for discharge

## 2015-12-07 NOTE — Discharge Instructions (Signed)
Return for worsening symptoms, including fever, difficulty breathing, severe cough, worsening pain, or any other symptoms concerning to you.  Chest Wall Pain Chest wall pain is pain in or around the bones and muscles of your chest. Sometimes, an injury causes this pain. Sometimes, the cause may not be known. This pain may take several weeks or longer to get better. HOME CARE INSTRUCTIONS  Pay attention to any changes in your symptoms. Take these actions to help with your pain:   Rest as told by your health care provider.   Avoid activities that cause pain. These include any activities that use your chest muscles or your abdominal and side muscles to lift heavy items.   If directed, apply ice to the painful area:  Put ice in a plastic bag.  Place a towel between your skin and the bag.  Leave the ice on for 20 minutes, 2-3 times per day.  Take over-the-counter and prescription medicines only as told by your health care provider.  Do not use tobacco products, including cigarettes, chewing tobacco, and e-cigarettes. If you need help quitting, ask your health care provider.  Keep all follow-up visits as told by your health care provider. This is important. SEEK MEDICAL CARE IF:  You have a fever.  Your chest pain becomes worse.  You have new symptoms. SEEK IMMEDIATE MEDICAL CARE IF:  You have nausea or vomiting.  You feel sweaty or light-headed.  You have a cough with phlegm (sputum) or you cough up blood.  You develop shortness of breath.   This information is not intended to replace advice given to you by your health care provider. Make sure you discuss any questions you have with your health care provider.   Document Released: 11/20/2005 Document Revised: 08/11/2015 Document Reviewed: 02/15/2015 Elsevier Interactive Patient Education [employer]2016 Elsevier Inc.

## 2015-12-07 NOTE — ED Notes (Signed)
Pt states she was dancing in her bedrrom x 2 weeks ago and hit her right ribcage on her dresser. Pt c/o continued pain to area.

## 2015-12-10 ENCOUNTER — Telehealth: Payer: Self-pay | Admitting: Family Medicine

## 2015-12-10 ENCOUNTER — Ambulatory Visit (INDEPENDENT_AMBULATORY_CARE_PROVIDER_SITE_OTHER): Payer: Medicaid Other | Admitting: Family Medicine

## 2015-12-10 ENCOUNTER — Encounter: Payer: Self-pay | Admitting: Family Medicine

## 2015-12-10 VITALS — BP 118/64 | HR 70 | Temp 97.9°F | Resp 16 | Ht 62.0 in | Wt 136.0 lb

## 2015-12-10 DIAGNOSIS — N390 Urinary tract infection, site not specified: Secondary | ICD-10-CM

## 2015-12-10 DIAGNOSIS — M545 Low back pain, unspecified: Secondary | ICD-10-CM

## 2015-12-10 LAB — URINALYSIS, ROUTINE W REFLEX MICROSCOPIC
Bilirubin Urine: NEGATIVE
Glucose, UA: NEGATIVE
Ketones, ur: NEGATIVE
Nitrite: NEGATIVE
PROTEIN: NEGATIVE
SPECIFIC GRAVITY, URINE: 1.015 (ref 1.001–1.035)
pH: 7 (ref 5.0–8.0)

## 2015-12-10 LAB — URINALYSIS, MICROSCOPIC ONLY
CRYSTALS: NONE SEEN [HPF]
Casts: NONE SEEN [LPF]
Yeast: NONE SEEN [HPF]

## 2015-12-10 MED ORDER — CIPROFLOXACIN HCL 500 MG PO TABS: 500.0000 mg | ORAL_TABLET | Freq: Two times a day (BID) | ORAL | Status: DC

## 2015-12-10 NOTE — Telephone Encounter (Signed)
Pt no longer taking medication on their medication list.  Asking for discontinue order.  Proventil HFA Inhaler.  State patient has been there two years and has never used.  Please advise?

## 2015-12-10 NOTE — Progress Notes (Signed)
Patient ID: Diana Ho, female   DOB: September 16, 1981, 35 y.o.   MRN: [id number]   Subjective:    Patient ID: Diana Ho, female    DOB: September 16, 1981, 35 y.o.   MRN: [id number]  Patient presents for ER F/U  Patient here for ER follow-up. She currently lives in a group home secondary to her mental retardation. She was seen in the emergency room on January 3 complaining of right-sided chest wall pain after she fell into a dresser while dancing. X-ray of ribs and chest were done which were normal. He was prescribed ibuprofen 600 mg as needed. However Group Home states she told them that AM she has having abdominal pain and back pain, but they do not actually go to the ER with the patients.  Today complains of mild dysuria, was recently treated by GYN for yeast infection with diflucan.  Also has a little back pain, no injury, no weakness. She does take the Motrin    Review Of Systems:  GEN- denies fatigue, fever, weight loss,weakness, recent illness HEENT- denies eye drainage, change in vision, nasal discharge, CVS- denies chest pain, palpitations RESP- denies SOB, cough, wheeze ABD- denies N/V, change in stools, abd pain GU- + dysuria, hematuria, dribbling, incontinence MSK-+ joint pain, muscle aches, injury Neuro- denies headache, dizziness, syncope, seizure activity       Objective:    BP 118/64 mmHg  Pulse 70  Temp(Src) 97.9 F (36.6 C) (Oral)  Resp 16  Ht 5\' 2"  (1.575 m)  Wt 136 lb (61.689 kg)  BMI 24.87 kg/m2 GEN- NAD, alert and oriented x3 HEENT- PERRL, EOMI, non injected sclera, pink conjunctiva, MMM, oropharynx clear CVS- RRR, no murmur RESP-CTAB ABD-NABS,soft,NT,ND, no CVA tenderness MSK- Spine NT, neg SLR, good ROM spine, strength equal bilat LE EXT- No edema Pulses- Radial 2+         Assessment & Plan:      Problem List Items Addressed This Visit    None    Visit Diagnoses    UTI (lower urinary tract infection)    -  Primary    As symptomatic Cipro x 3  days, mild infeciton     Relevant Orders    Urinalysis, Routine w reflex microscopic (not at [facility])    Acute midline low back pain without sciatica        MSK pain, no red flags, normal exam, Ibuprofen, or heating pad as needed       Note: This dictation was prepared with Dragon dictation along with smaller phrase technology. Any transcriptional errors that result from this process are unintentional.

## 2015-12-10 NOTE — Telephone Encounter (Signed)
Order faxed to adult home to discontinue inhaler

## 2015-12-10 NOTE — Patient Instructions (Addendum)
Continue Ibuprofen as needed for another week, then stop Use heating pad if needed for back Take antibiotics as prescribed Cipro for next 3 days  F/u as needed

## 2015-12-10 NOTE — Telephone Encounter (Signed)
Okay to DC 

## 2015-12-10 NOTE — Addendum Note (Signed)
Addended by: Donne AnonPLUMMER, Machaela Caterino M on: 12/10/2015 11:20 AM   Modules accepted: Orders, Medications

## 2015-12-28 ENCOUNTER — Other Ambulatory Visit: Payer: Self-pay | Admitting: Family Medicine

## 2016-05-30 ENCOUNTER — Other Ambulatory Visit: Payer: Self-pay | Admitting: Family Medicine

## 2016-07-31 ENCOUNTER — Other Ambulatory Visit: Payer: Self-pay | Admitting: Family Medicine

## 2016-08-02 ENCOUNTER — Other Ambulatory Visit: Payer: Self-pay | Admitting: *Deleted

## 2016-08-02 MED ORDER — OMEPRAZOLE 20 MG PO CPDR: DELAYED_RELEASE_CAPSULE | ORAL | 11 refills | Status: DC

## 2016-08-02 NOTE — Telephone Encounter (Signed)
Received fax requesting refill on Omepraazole.   Refill appropriate and filled per protocol.

## 2016-11-24 ENCOUNTER — Other Ambulatory Visit: Payer: Self-pay | Admitting: Family Medicine

## 2017-01-26 ENCOUNTER — Ambulatory Visit (INDEPENDENT_AMBULATORY_CARE_PROVIDER_SITE_OTHER): Payer: Medicaid Other | Admitting: Family Medicine

## 2017-01-26 ENCOUNTER — Telehealth: Payer: Self-pay

## 2017-01-26 VITALS — BP 108/72 | HR 70 | Temp 97.9°F | Resp 14 | Wt 146.6 lb

## 2017-01-26 DIAGNOSIS — E663 Overweight: Secondary | ICD-10-CM

## 2017-01-26 DIAGNOSIS — Z124 Encounter for screening for malignant neoplasm of cervix: Secondary | ICD-10-CM

## 2017-01-26 DIAGNOSIS — Z3046 Encounter for surveillance of implantable subdermal contraceptive: Secondary | ICD-10-CM

## 2017-01-26 DIAGNOSIS — F79 Unspecified intellectual disabilities: Secondary | ICD-10-CM

## 2017-01-26 DIAGNOSIS — B3731 Acute candidiasis of vulva and vagina: Secondary | ICD-10-CM

## 2017-01-26 DIAGNOSIS — Z0000 Encounter for general adult medical examination without abnormal findings: Secondary | ICD-10-CM

## 2017-01-26 DIAGNOSIS — B373 Candidiasis of vulva and vagina: Secondary | ICD-10-CM

## 2017-01-26 DIAGNOSIS — Z79899 Other long term (current) drug therapy: Secondary | ICD-10-CM

## 2017-01-26 DIAGNOSIS — N63 Unspecified lump in unspecified breast: Secondary | ICD-10-CM

## 2017-01-26 LAB — WET PREP FOR TRICH, YEAST, CLUE
Clue Cells Wet Prep HPF POC: NONE SEEN
Trich, Wet Prep: NONE SEEN
WBC WET PREP: NONE SEEN

## 2017-01-26 MED ORDER — TRAZODONE HCL 50 MG PO TABS: 50.0000 mg | ORAL_TABLET | Freq: Every evening | ORAL | 3 refills | Status: DC | PRN

## 2017-01-26 MED ORDER — ALBUTEROL SULFATE HFA 108 (90 BASE) MCG/ACT IN AERS: 2.0000 | INHALATION_SPRAY | RESPIRATORY_TRACT | 2 refills | Status: DC | PRN

## 2017-01-26 MED ORDER — FLUCONAZOLE 150 MG PO TABS: 150.0000 mg | ORAL_TABLET | Freq: Once | ORAL | 0 refills | Status: AC

## 2017-01-26 NOTE — Telephone Encounter (Signed)
Diana Ho a supervisor called and stated pt did not have a yeast infection. Explained to Diana Ho that we did a culture and that is what showed up also instructed Diana Ho to give [name] the Rx as prescribed

## 2017-01-26 NOTE — Progress Notes (Signed)
   Subjective:    Patient ID: Diana Ho, female    DOB: 10-21-1981, 36 y.o.   MRN: [id number]  Patient presents for Annual Exam   Pt here for annual exam and review of medications. Followed by psychiatry, recently had  Trazodone added for insomnia. Due for PAP Smear   Overdue for nexplanon removal ,states it hurts her arm at night, it gets sore.  Lives in Group home, does not have nurse with her today No concerns on her paperwork  She also felt a knot her right breast, states it is sore, not sure how long it has been there   Review Of Systems:  GEN- denies fatigue, fever, weight loss,weakness, recent illness HEENT- denies eye drainage, change in vision, nasal discharge, CVS- denies chest pain, palpitations RESP- denies SOB, cough, wheeze ABD- denies N/V, change in stools, abd pain GU- denies dysuria, hematuria, dribbling, incontinence MSK- denies joint pain, muscle aches, injury Neuro- denies headache, dizziness, syncope, seizure activity       Objective:    BP 108/72   Pulse 70   Temp 97.9 F (36.6 C) (Oral)   Resp 14   Wt 146 lb 9.6 oz (66.5 kg)   SpO2 98%   BMI 26.81 kg/m  GEN- NAD, alert and oriented x3 HEENT- PERRL, EOMI, non injected sclera, pink conjunctiva, MMM, oropharynx clear Neck- Supple, no thyromegaly Breast- normal symmetry, no nipple inversion,no nipple drainage, no discrete  nodules but dense tissue, TTP Right breast 5 oclock position Nodes- no axillary nodes CVS- RRR, no murmur RESP-CTAB ABD-NABS,soft,NT,ND GU- normal external genitalia, vaginal mucosa pink and moist, cervix visualized no growth, no blood form os, + thick white discharge, no CMT, no ovarian masses, uterus normal size EXT- No edema Pulses- Radial 2+        Assessment & Plan:      Problem List Items Addressed This Visit    Overweight    Encourage walking not eating a lot of junkfood      Mental retardation   Encounter for long-term (current) use of medications -  Primary    Other Visit Diagnoses    Routine general medical examination at a health care facility       CPE done, I think breast tenderness is more fibrocystic, will obtainmammogram. Check labs   Relevant Orders   CBC with Differential/Platelet (Completed)   Comprehensive metabolic panel (Completed)   Lipid panel (Completed)   TSH (Completed)   Cervical cancer screening       PAP Smear done, YEAST infection,    Relevant Orders   PAP, ThinPrep ASCUS Rflx HPV Rflx Type   Vaginal yeast infection       Relevant Orders   WET PREP FOR TRICH, YEAST, CLUE (Completed)   Breast nodule       Relevant Orders   MS DIGITAL DIAG TOMO BILAT   US BREAST LTD UNI LEFT INC AXILLA   US BREAST LTD UNI RIGHT INC AXILLA   Encounter for surveillance of implantable subdermal contraceptive       Send to GYN for removal of nexplanon, needs some type of birth control reinserted   Relevant Orders   Ambulatory referral to Obstetrics / Gynecology      Note: This dictation was prepared with Dragon dictation along with smaller phrase technology. Any transcriptional errors that result from this process are unintentional.

## 2017-01-26 NOTE — Patient Instructions (Addendum)
F/U 1 year for Physical Referral to GYN for nexplanon  Mammogram to be set up  Diflucan given for yeast infection

## 2017-01-27 LAB — CBC WITH DIFFERENTIAL/PLATELET
BASOS ABS: 0 {cells}/uL (ref 0–200)
Basophils Relative: 0 %
EOS ABS: 84 {cells}/uL (ref 15–500)
Eosinophils Relative: 1 %
HEMATOCRIT: 42.6 % (ref 35.0–45.0)
HEMOGLOBIN: 13.8 g/dL (ref 12.0–15.0)
LYMPHS PCT: 37 %
Lymphs Abs: 3108 cells/uL (ref 850–3900)
MCH: 30.2 pg (ref 27.0–33.0)
MCHC: 32.4 g/dL (ref 32.0–36.0)
MCV: 93.2 fL (ref 80.0–100.0)
MONO ABS: 756 {cells}/uL (ref 200–950)
MPV: 10.1 fL (ref 7.5–12.5)
Monocytes Relative: 9 %
NEUTROS PCT: 53 %
Neutro Abs: 4452 cells/uL (ref 1500–7800)
Platelets: 224 10*3/uL (ref 140–400)
RBC: 4.57 MIL/uL (ref 3.80–5.10)
RDW: 13.8 % (ref 11.0–15.0)
WBC: 8.4 10*3/uL (ref 3.8–10.8)

## 2017-01-27 LAB — COMPREHENSIVE METABOLIC PANEL
ALBUMIN: 4.6 g/dL (ref 3.6–5.1)
ALK PHOS: 49 U/L (ref 33–115)
ALT: 10 U/L (ref 6–29)
AST: 13 U/L (ref 10–30)
BUN: 10 mg/dL (ref 7–25)
CALCIUM: 9.4 mg/dL (ref 8.6–10.2)
CO2: 27 mmol/L (ref 20–31)
Chloride: 104 mmol/L (ref 98–110)
Creat: 0.84 mg/dL (ref 0.50–1.10)
GLUCOSE: 81 mg/dL (ref 70–99)
POTASSIUM: 4.1 mmol/L (ref 3.5–5.3)
Sodium: 142 mmol/L (ref 135–146)
Total Bilirubin: 0.3 mg/dL (ref 0.2–1.2)
Total Protein: 6.9 g/dL (ref 6.1–8.1)

## 2017-01-27 LAB — LIPID PANEL
CHOL/HDL RATIO: 3 ratio (ref <5.0)
Cholesterol: 132 mg/dL (ref <200)
HDL: 44 mg/dL — AB (ref >50)
LDL CALC: 75 mg/dL (ref <100)
TRIGLYCERIDES: 66 mg/dL (ref <150)
VLDL: 13 mg/dL (ref <30)

## 2017-01-27 LAB — TSH: TSH: 2.29 m[IU]/L

## 2017-01-27 NOTE — Assessment & Plan Note (Signed)
Encourage walking not eating a lot of junkfood

## 2017-01-30 LAB — PAP THINPREP ASCUS RFLX HPV RFLX TYPE

## 2017-02-01 ENCOUNTER — Encounter: Payer: Self-pay | Admitting: *Deleted

## 2017-02-13 ENCOUNTER — Ambulatory Visit (HOSPITAL_COMMUNITY)
Admission: RE | Admit: 2017-02-13 | Discharge: 2017-02-13 | Disposition: A | Discharge status: Home / Self Care | Payer: Medicaid Other | Source: Ambulatory Visit | Attending: Family Medicine | Admitting: Family Medicine

## 2017-02-13 DIAGNOSIS — N6310 Unspecified lump in the right breast, unspecified quadrant: Secondary | ICD-10-CM | POA: Insufficient documentation

## 2017-02-13 DIAGNOSIS — N63 Unspecified lump in unspecified breast: Secondary | ICD-10-CM

## 2017-02-15 ENCOUNTER — Encounter: Payer: Medicaid Other | Admitting: Adult Health

## 2017-02-21 ENCOUNTER — Encounter: Payer: Medicaid Other | Admitting: Adult Health

## 2017-02-27 ENCOUNTER — Other Ambulatory Visit: Payer: Self-pay | Admitting: Family Medicine

## 2017-03-06 ENCOUNTER — Ambulatory Visit (INDEPENDENT_AMBULATORY_CARE_PROVIDER_SITE_OTHER): Payer: Medicaid Other | Admitting: Adult Health

## 2017-03-06 ENCOUNTER — Encounter: Payer: Self-pay | Admitting: Adult Health

## 2017-03-06 VITALS — BP 100/60 | HR 82 | Ht 64.0 in | Wt 143.0 lb

## 2017-03-06 DIAGNOSIS — Z30017 Encounter for initial prescription of implantable subdermal contraceptive: Secondary | ICD-10-CM

## 2017-03-06 DIAGNOSIS — Z3202 Encounter for pregnancy test, result negative: Secondary | ICD-10-CM | POA: Diagnosis not present

## 2017-03-06 DIAGNOSIS — Z3046 Encounter for surveillance of implantable subdermal contraceptive: Secondary | ICD-10-CM

## 2017-03-06 LAB — POCT URINE PREGNANCY: Preg Test, Ur: NEGATIVE

## 2017-03-06 NOTE — Progress Notes (Signed)
Subjective:     Patient ID: Diana Ho, female   DOB: 06-22-1981, 36 y.o.   MRN: [id number]  HPI Diana Ho is a 36 year old white female, in for nexplanon removal and reinsertion.She resides at [facility]. PCP is Dr Jeanice Lim.  Review of Systems For nexplanon removal and reinsertion Reviewed past medical,surgical, social and family history. Reviewed medications and allergies.     Objective:   Physical Exam BP 100/60 (BP Location: Left Arm, Patient Position: Sitting, Cuff Size: Normal)   Pulse 82   Ht  (1.626 m)   Wt 143 lb (64.9 kg)   BMI 24.55 kg/m UPT negative, COnsent signed, time out called,left arm cleansed with betadine, and injected with 1.5 cc 1% lidocaine and waited til numb.Under sterile technique a #11 blade was used to make small vertical incision, and a curved forceps was used to easily remove rod then new nexplanon inserted in same opening and steri strips applied and then pressure dressing applied.     Assessment:     1. Encounter for Nexplanon removal   2. Insertion of Nexplanon   3. Pregnancy examination or test, negative result    lot [id number] exp 11/19    Plan:     Use condoms, keep clean and dry x 24 hours, no heavy lifting, keep steri strips on x 72 hours, Keep pressure dressing on x 24 hours. Follow up prn problems. Return in 3 years for removal of nexplanon

## 2017-03-06 NOTE — Patient Instructions (Signed)
Use condoms, keep clean and dry x 24 hours, no heavy lifting, keep steri strips on x 72 hours, Keep pressure dressing on x 24 hours. Follow up prn problems.  

## 2017-04-25 ENCOUNTER — Other Ambulatory Visit: Payer: Self-pay | Admitting: Family Medicine

## 2017-05-02 ENCOUNTER — Other Ambulatory Visit: Payer: Self-pay | Admitting: Family Medicine

## 2017-05-31 ENCOUNTER — Other Ambulatory Visit: Payer: Self-pay | Admitting: Family Medicine

## 2017-05-31 ENCOUNTER — Other Ambulatory Visit: Payer: Self-pay | Admitting: *Deleted

## 2017-05-31 MED ORDER — CALCIUM CARBONATE 600 MG PO TABS: 1.0000 | ORAL_TABLET | Freq: Every day | ORAL | 3 refills | Status: DC

## 2017-08-07 ENCOUNTER — Other Ambulatory Visit: Payer: Self-pay | Admitting: Family Medicine

## 2017-08-07 NOTE — Telephone Encounter (Signed)
Refill appropriate 

## 2017-08-22 ENCOUNTER — Ambulatory Visit (INDEPENDENT_AMBULATORY_CARE_PROVIDER_SITE_OTHER): Payer: Medicaid Other | Admitting: Family Medicine

## 2017-08-22 ENCOUNTER — Encounter: Payer: Self-pay | Admitting: Family Medicine

## 2017-08-22 VITALS — BP 106/70 | HR 86 | Temp 98.7°F | Resp 16 | Ht 64.0 in | Wt 144.0 lb

## 2017-08-22 DIAGNOSIS — J069 Acute upper respiratory infection, unspecified: Secondary | ICD-10-CM

## 2017-08-22 MED ORDER — GUAIFENESIN-CODEINE 100-10 MG/5ML PO SOLN: 5.0000 mL | Freq: Four times a day (QID) | ORAL | 0 refills | Status: DC | PRN

## 2017-08-22 MED ORDER — FLUTICASONE PROPIONATE 50 MCG/ACT NA SUSP: 2.0000 | Freq: Every day | NASAL | 0 refills | Status: AC

## 2017-08-22 MED ORDER — AZITHROMYCIN 250 MG PO TABS: ORAL_TABLET | ORAL | 0 refills | Status: DC

## 2017-08-22 NOTE — Patient Instructions (Addendum)
Take the antibiotics Use nasal spray If cough medicine too costly, then get Delsym Strep test negative  F/U as previous

## 2017-08-22 NOTE — Progress Notes (Signed)
   Subjective:    Patient ID: Diana Ho, female    DOB: 07/18/1981, 36 y.o.   MRN: [id number]  Patient presents for Illness (x1 week- fever, sore throat, productive cough, hoarse)  Low grade fever 99.4F,Started yesterday she is not sure she had fever last week given robitussin, productive cough, sore thraot, hoarse, was staying with family for a week, here with caregiver today.  Had tylneol and robitussin this morning around 6:30am  No diarrhea, no vomiting  No use of albuterol . Cough is keeping her up at night.    Review Of Systems:  GEN- denies fatigue, fever, weight loss,weakness, recent illness HEENT- denies eye drainage, change in vision, +nasal discharge, CVS- denies chest pain, palpitations RESP- denies SOB, +cough, wheeze ABD- denies N/V, change in stools, abd pain GU- denies dysuria, hematuria, dribbling, incontinence MSK- denies joint pain, muscle aches, injury Neuro- denies headache, dizziness, syncope, seizure activity       Objective:    BP 106/70   Pulse 86   Temp 98.7 F (37.1 C) (Oral)   Resp 16   Ht  (1.626 m)   Wt 144 lb (65.3 kg)   SpO2 98%   BMI 24.72 kg/m  GEN- NAD, alert and oriented x3 HEENT- PERRL, EOMI, non injected sclera, pink conjunctiva, MMM, oropharynx mild injection, TM clear bilat no effusion,  No  maxillary sinus tenderness, inflammed turbinates,  Nasal drainage ,  Neck- Supple, + shotty LAD  CVS- RRR, no murmur RESP-CTAB EXT- No edema Pulses- Radial 2+    Strep negative       Assessment & Plan:      Problem List Items Addressed This Visit    None    Visit Diagnoses    Acute URI    -  Primary   Concern this is progressing over the week, difficult to discrene from her due to her cognitive abilities. Will treat with zpak,nasal steroid, robitussn codiene    Relevant Medications   azithromycin (ZITHROMAX) 250 MG tablet   Other Relevant Orders   STREP GROUP A AG, W/REFLEX TO CULT (Completed)      Note: This  dictation was prepared with Dragon dictation along with smaller phrase technology. Any transcriptional errors that result from this process are unintentional.

## 2017-08-24 LAB — CULTURE, GROUP A STREP
MICRO NUMBER: 81035534
SPECIMEN QUALITY:: ADEQUATE

## 2017-08-24 LAB — STREP GROUP A AG, W/REFLEX TO CULT: Streptococcus, Group A Screen (Direct): NOT DETECTED

## 2017-08-29 ENCOUNTER — Other Ambulatory Visit: Payer: Self-pay | Admitting: Family Medicine

## 2017-10-03 ENCOUNTER — Other Ambulatory Visit: Payer: Self-pay | Admitting: *Deleted

## 2017-10-03 ENCOUNTER — Other Ambulatory Visit: Payer: Self-pay | Admitting: Family Medicine

## 2017-10-03 MED ORDER — OMEPRAZOLE 20 MG PO CPDR: DELAYED_RELEASE_CAPSULE | ORAL | 11 refills | Status: DC

## 2017-12-06 ENCOUNTER — Encounter: Payer: Self-pay | Admitting: Adult Health

## 2017-12-06 ENCOUNTER — Ambulatory Visit (INDEPENDENT_AMBULATORY_CARE_PROVIDER_SITE_OTHER): Payer: Medicaid Other | Admitting: Adult Health

## 2017-12-06 VITALS — BP 96/50 | HR 77 | Resp 14 | Ht 62.0 in | Wt 151.0 lb

## 2017-12-06 DIAGNOSIS — N644 Mastodynia: Secondary | ICD-10-CM | POA: Diagnosis not present

## 2017-12-06 DIAGNOSIS — N6314 Unspecified lump in the right breast, lower inner quadrant: Secondary | ICD-10-CM

## 2017-12-06 NOTE — Progress Notes (Signed)
Subjective:     Patient ID: Marthe PatchAmanda N Yu, female   DOB: 12/27/1980, 37 y.o.   MRN: [id number]  HPI [name] is a 37 year old white female, in complaining of bilateral breast pain and lumps, [facility].  Review of Systems Has bilateral breast pain and lumps Reviewed past medical,surgical, social and family history. Reviewed medications and allergies.     Objective:   Physical Exam BP (!) 96/50 (BP Location: Left Arm, Patient Position: Sitting, Cuff Size: Normal)   Pulse 77   Resp 14   Ht 5\' 2"  (1.575 m)   Wt 151 lb (68.5 kg)   BMI 27.62 kg/m     Skin warm and dry,  Breasts:no dominate palpable mass, retraction or nipple discharge on left, has some regular irregularities, and on right, no retraction or nipple discharge but has 1 cm, mobile, tender nodule at 4 0'clock. Will get diagnostic mammogram .  Assessment:     1. Mass of lower inner quadrant of right breast   2. Breast pain in female       Plan:     Diagnotic bilateral mammogram and right and left US scheduled 12/18/17 at 2;20 pm at [facility] F/U prn

## 2017-12-18 ENCOUNTER — Ambulatory Visit (HOSPITAL_COMMUNITY)
Admission: RE | Admit: 2017-12-18 | Discharge: 2017-12-18 | Disposition: A | Discharge status: Home / Self Care | Payer: Medicaid Other | Source: Ambulatory Visit | Attending: Adult Health | Admitting: Adult Health

## 2017-12-18 ENCOUNTER — Encounter: Payer: Self-pay | Admitting: Family Medicine

## 2017-12-18 DIAGNOSIS — N644 Mastodynia: Secondary | ICD-10-CM | POA: Diagnosis present

## 2017-12-18 DIAGNOSIS — N6314 Unspecified lump in the right breast, lower inner quadrant: Secondary | ICD-10-CM

## 2018-01-28 ENCOUNTER — Encounter: Payer: Medicaid Other | Admitting: Family Medicine

## 2018-02-01 ENCOUNTER — Encounter: Payer: Self-pay | Admitting: Family Medicine

## 2018-02-01 ENCOUNTER — Ambulatory Visit (INDEPENDENT_AMBULATORY_CARE_PROVIDER_SITE_OTHER): Payer: Medicaid Other | Admitting: Family Medicine

## 2018-02-01 ENCOUNTER — Other Ambulatory Visit: Payer: Self-pay

## 2018-02-01 VITALS — BP 102/62 | HR 70 | Temp 98.1°F | Resp 16 | Ht 62.0 in | Wt 155.0 lb

## 2018-02-01 DIAGNOSIS — Z79899 Other long term (current) drug therapy: Secondary | ICD-10-CM | POA: Diagnosis not present

## 2018-02-01 DIAGNOSIS — F319 Bipolar disorder, unspecified: Secondary | ICD-10-CM

## 2018-02-01 DIAGNOSIS — Z0000 Encounter for general adult medical examination without abnormal findings: Secondary | ICD-10-CM | POA: Diagnosis not present

## 2018-02-01 DIAGNOSIS — Z23 Encounter for immunization: Secondary | ICD-10-CM | POA: Diagnosis not present

## 2018-02-01 DIAGNOSIS — S39012A Strain of muscle, fascia and tendon of lower back, initial encounter: Secondary | ICD-10-CM

## 2018-02-01 DIAGNOSIS — E663 Overweight: Secondary | ICD-10-CM

## 2018-02-01 LAB — CBC WITH DIFFERENTIAL/PLATELET
BASOS PCT: 0.5 %
Basophils Absolute: 51 cells/uL (ref 0–200)
EOS ABS: 133 {cells}/uL (ref 15–500)
Eosinophils Relative: 1.3 %
HCT: 40.4 % (ref 35.0–45.0)
HEMOGLOBIN: 13.8 g/dL (ref 11.7–15.5)
Lymphs Abs: 4447 cells/uL — ABNORMAL HIGH (ref 850–3900)
MCH: 30.8 pg (ref 27.0–33.0)
MCHC: 34.2 g/dL (ref 32.0–36.0)
MCV: 90.2 fL (ref 80.0–100.0)
MONOS PCT: 6.8 %
MPV: 10.5 fL (ref 7.5–12.5)
NEUTROS ABS: 4876 {cells}/uL (ref 1500–7800)
Neutrophils Relative %: 47.8 %
Platelets: 213 10*3/uL (ref 140–400)
RBC: 4.48 10*6/uL (ref 3.80–5.10)
RDW: 13.1 % (ref 11.0–15.0)
Total Lymphocyte: 43.6 %
WBC mixed population: 694 cells/uL (ref 200–950)
WBC: 10.2 10*3/uL (ref 3.8–10.8)

## 2018-02-01 LAB — COMPREHENSIVE METABOLIC PANEL
AG RATIO: 2 (calc) (ref 1.0–2.5)
ALBUMIN MSPROF: 4.3 g/dL (ref 3.6–5.1)
ALT: 9 U/L (ref 6–29)
AST: 13 U/L (ref 10–30)
Alkaline phosphatase (APISO): 46 U/L (ref 33–115)
BUN: 13 mg/dL (ref 7–25)
CHLORIDE: 104 mmol/L (ref 98–110)
CO2: 28 mmol/L (ref 20–32)
Calcium: 9.8 mg/dL (ref 8.6–10.2)
Creat: 0.86 mg/dL (ref 0.50–1.10)
GLOBULIN: 2.2 g/dL (ref 1.9–3.7)
GLUCOSE: 74 mg/dL (ref 65–99)
POTASSIUM: 4.6 mmol/L (ref 3.5–5.3)
SODIUM: 140 mmol/L (ref 135–146)
TOTAL PROTEIN: 6.5 g/dL (ref 6.1–8.1)
Total Bilirubin: 0.3 mg/dL (ref 0.2–1.2)

## 2018-02-01 LAB — LIPID PANEL
CHOL/HDL RATIO: 3.1 (calc) (ref <5.0)
Cholesterol: 122 mg/dL (ref <200)
HDL: 39 mg/dL — ABNORMAL LOW (ref >50)
LDL Cholesterol (Calc): 65 mg/dL (calc)
NON-HDL CHOLESTEROL (CALC): 83 mg/dL (ref <130)
Triglycerides: 97 mg/dL (ref <150)

## 2018-02-01 LAB — TSH: TSH: 3.33 m[IU]/L

## 2018-02-01 NOTE — Assessment & Plan Note (Signed)
Medications per psychiatry 

## 2018-02-01 NOTE — Progress Notes (Signed)
   Subjective:    Patient ID: Diana Ho, female    DOB: 10-17-1981, 37 y.o.   MRN: [id number]  Patient presents for CPE (is not fasting)   Pt here for CPE   She has intermittently complained of back pain, not sure how long this has been going on, she state she felt it a few minutes ago.Non radiating  Care giver states she has not appeared to be in any pain, has not changed her activities No particular injury   Reviewed medications   Due for flu shot  GYN- UTD, has nexplanon, PAP UTD  Continue to follow with psychiatry   Review Of Systems:  GEN- denies fatigue, fever, weight loss,weakness, recent illness HEENT- denies eye drainage, change in vision, nasal discharge, CVS- denies chest pain, palpitations RESP- denies SOB, cough, wheeze ABD- denies N/V, change in stools, abd pain GU- denies dysuria, hematuria, dribbling, incontinence MSK- denies joint pain, muscle aches, injury Neuro- denies headache, dizziness, syncope, seizure activity       Objective:    BP 102/62   Pulse 70   Temp 98.1 F (36.7 C) (Oral)   Resp 16   Ht 5\' 2"  (1.575 m)   Wt 155 lb (70.3 kg)   SpO2 99%   BMI 28.35 kg/m  GEN- NAD, alert and oriented x3 HEENT- PERRL, EOMI, non injected sclera, pink conjunctiva, MMM, oropharynx clear Neck- Supple, no thyromegaly CVS- RRR, no murmur RESP-CTAB ABD-NABS,soft,NT,ND MSK- Mild TTP lumbar spine and paraspinals, FROM neg SLR, mild discomfort with extension  Neuro- Normal tone LE, sensation in tact, DTR symmetric ,strength equal bilat, normal gait  EXT- No edema Pulses- Radial, DP- 2+        Assessment & Plan:      Problem List Items Addressed This Visit      Unprioritized   Overweight    10lb weight gain in past 6 months Discussed avoiding a lot of snacks, sugary treats, she spent a lot of time with her family past couple months      Bipolar 1 disorder (HCC)    Medications per psychiatry       Other Visit Diagnoses    Routine general  medical examination at a health care facility    -  Primary   CPE done, flu shot given, non fasting labs, MSK strain, OTC NSAID Heat, advised caregiver to return if pain worsens or radiates   Relevant Orders   CBC with Differential/Platelet   Comprehensive metabolic panel   Lipid panel   TSH   Strain of lumbar region, initial encounter       Long-term use of high-risk medication       Relevant Orders   Lipid panel   TSH   Need for immunization against influenza       Relevant Orders   Flu Vaccine QUAD 36+ mos IM (Completed)      Note: This dictation was prepared with Dragon dictation along with smaller phrase technology. Any transcriptional errors that result from this process are unintentional.

## 2018-02-01 NOTE — Assessment & Plan Note (Signed)
10lb weight gain in past 6 months Discussed avoiding a lot of snacks, sugary treats, she spent a lot of time with her family past couple months

## 2018-02-01 NOTE — Patient Instructions (Signed)
F/U 6 months  Use ibuprofen, heat and stretching the back

## 2018-02-06 ENCOUNTER — Encounter: Payer: Self-pay | Admitting: *Deleted

## 2018-02-07 ENCOUNTER — Other Ambulatory Visit: Payer: Self-pay | Admitting: *Deleted

## 2018-02-07 MED ORDER — TRAZODONE HCL 50 MG PO TABS: 50.0000 mg | ORAL_TABLET | Freq: Every day | ORAL | Status: DC

## 2018-03-08 ENCOUNTER — Other Ambulatory Visit: Payer: Self-pay | Admitting: Family Medicine

## 2018-04-02 ENCOUNTER — Other Ambulatory Visit: Payer: Self-pay | Admitting: Family Medicine

## 2018-04-18 ENCOUNTER — Telehealth: Payer: Self-pay | Admitting: Obstetrics and Gynecology

## 2018-04-18 NOTE — Telephone Encounter (Signed)
The Facility that Diana Ho stays at called stating that Diana Ho have overly touched her private area and has caused a rash down there. Lady states that Diana Ho complaining that it burns when she urinates. They would like Doctor Emelda Fear to call her in a cream for that. Please contact facility when medication has been sent.

## 2018-04-19 ENCOUNTER — Telehealth: Payer: Self-pay | Admitting: *Deleted

## 2018-04-19 MED ORDER — NYSTATIN-TRIAMCINOLONE 100000-0.1 UNIT/GM-% EX OINT: 1.0000 "application " | TOPICAL_OINTMENT | Freq: Two times a day (BID) | CUTANEOUS | 99 refills | Status: DC

## 2018-04-19 NOTE — Telephone Encounter (Signed)
Facility notified medication was sent however they only use [facility].  Will resend the medication.

## 2018-04-19 NOTE — Telephone Encounter (Signed)
mycolog sent to pharmacy

## 2018-04-30 ENCOUNTER — Other Ambulatory Visit: Payer: Self-pay | Admitting: Family Medicine

## 2018-05-07 ENCOUNTER — Telehealth: Payer: Self-pay | Admitting: *Deleted

## 2018-05-07 NOTE — Telephone Encounter (Signed)
[facility]   Reports that patient is refusing her Flonase almost everyday stating that she does not need it. Requested order to change directions to PRN.   Ok to change?

## 2018-05-07 NOTE — Telephone Encounter (Signed)
Agree with above Orders signed

## 2018-05-13 ENCOUNTER — Other Ambulatory Visit: Payer: Self-pay | Admitting: Obstetrics and Gynecology

## 2018-05-13 MED ORDER — NYSTATIN-TRIAMCINOLONE 100000-0.1 UNIT/GM-% EX OINT: 1.0000 "application " | TOPICAL_OINTMENT | Freq: Two times a day (BID) | CUTANEOUS | 99 refills | Status: AC | PRN

## 2018-05-13 NOTE — Progress Notes (Signed)
Switched Rx to bid prn

## 2018-05-22 ENCOUNTER — Other Ambulatory Visit: Payer: Self-pay

## 2018-05-22 ENCOUNTER — Ambulatory Visit (HOSPITAL_COMMUNITY)
Admission: RE | Admit: 2018-05-22 | Discharge: 2018-05-22 | Disposition: A | Discharge status: Home / Self Care | Payer: Medicaid Other | Source: Ambulatory Visit | Attending: Family Medicine | Admitting: Family Medicine

## 2018-05-22 ENCOUNTER — Encounter: Payer: Self-pay | Admitting: Family Medicine

## 2018-05-22 ENCOUNTER — Ambulatory Visit (INDEPENDENT_AMBULATORY_CARE_PROVIDER_SITE_OTHER): Payer: Medicaid Other | Admitting: Family Medicine

## 2018-05-22 VITALS — BP 110/62 | HR 68 | Temp 98.4°F | Resp 14 | Ht 62.0 in | Wt 162.0 lb

## 2018-05-22 DIAGNOSIS — M419 Scoliosis, unspecified: Secondary | ICD-10-CM | POA: Insufficient documentation

## 2018-05-22 DIAGNOSIS — M4124 Other idiopathic scoliosis, thoracic region: Secondary | ICD-10-CM | POA: Insufficient documentation

## 2018-05-22 DIAGNOSIS — R300 Dysuria: Secondary | ICD-10-CM

## 2018-05-22 LAB — URINALYSIS, ROUTINE W REFLEX MICROSCOPIC
BILIRUBIN URINE: NEGATIVE
Glucose, UA: NEGATIVE
HGB URINE DIPSTICK: NEGATIVE
KETONES UR: NEGATIVE
Leukocytes, UA: NEGATIVE
NITRITE: NEGATIVE
PROTEIN: NEGATIVE
Specific Gravity, Urine: 1.02 (ref 1.001–1.03)
pH: 7 (ref 5.0–8.0)

## 2018-05-22 NOTE — Patient Instructions (Addendum)
Get xrays done at [facility] We will call with culture results F/U as previous

## 2018-05-22 NOTE — Progress Notes (Signed)
   Subjective:    Patient ID: Diana Ho, female    DOB: 26-Nov-1981, 37 y.o.   MRN: [id number]  Patient presents for Side/ Back Pain (x month- side pain moving into lower back pain) and Dysuria (x month- states that she has pain with urination and it hurts her side when she goes to [facility])  She is here with her father as well as her stepmother.  She is now back in their custody.  She was checked out of her group home 2 weeks ago.  Parents state that she was more combative with the nurses she was not listening they were having more more behavioral problems.  They would get her on the weekends and she will often act up when she has to go back.  Now that her father is retired to him to try to keep her at home see how she does if there is an issue they plan to put her in a different group home.  She has complained of low back pain and some pain with urination over the past few weeks.  They are unsure how long it has truly been going on.  Stepmother was concerned about her scoliosis.  She did have x-rays back in 2013 that showed very mild thoracic scoliosis. There is no radiating pain no new paresthesias.  Still following with her psychiatrist she did have her trazodone increased as well as her Klonopin   Review Of Systems:  GEN- denies fatigue, fever, weight loss,weakness, recent illness HEENT- denies eye drainage, change in vision, nasal discharge, CVS- denies chest pain, palpitations RESP- denies SOB, cough, wheeze ABD- denies N/V, change in stools, abd pain GU- denies dysuria, hematuria, dribbling, incontinence MSK-+ joint pain, muscle aches, injury Neuro- denies headache, dizziness, syncope, seizure activity       Objective:    BP 110/62   Pulse 68   Temp 98.4 F (36.9 C) (Oral)   Resp 14   Ht 5\' 2"  (1.575 m)   Wt 162 lb (73.5 kg)   SpO2 99%   BMI 29.63 kg/m  GEN- NAD, alert and oriented x3 HEENT- PERRL, EOMI, non injected sclera, pink conjunctiva, MMM, oropharynx clear CVS-  RRR, no murmur RESP-CTAB ABD-NABS,soft,NT,ND Spine- NT to palpation, neg SLR, no significant curvature noticed, Fair ROM hips/knees Neuro- strength equal and normal Bilat LE, sensation in tact Bilat  EXT- No edema Pulses- Radial 2+        Assessment & Plan:      Problem List Items Addressed This Visit    None    Visit Diagnoses    Dysuria    -  Primary   She does tend to hold her urine , UA is negative send for culture, moving bowels normally   Relevant Orders   Urinalysis, Routine w reflex microscopic (Completed)   Urine Culture   Other idiopathic scoliosis, thoracic region       Very mild in the past, difficult to tell with patient as she complains about many things, obtain repeat xrays, using tylenol or motrin as needed    Relevant Orders   DG Thoracic Spine W/Swimmers   DG Lumbar Spine Complete      Note: This dictation was prepared with Dragon dictation along with smaller phrase technology. Any transcriptional errors that result from this process are unintentional.

## 2018-05-23 LAB — URINE CULTURE
MICRO NUMBER:: 90734242
SPECIMEN QUALITY:: ADEQUATE

## 2018-07-31 ENCOUNTER — Other Ambulatory Visit: Payer: Self-pay

## 2018-07-31 ENCOUNTER — Encounter: Payer: Self-pay | Admitting: Family Medicine

## 2018-07-31 ENCOUNTER — Ambulatory Visit (HOSPITAL_COMMUNITY)
Admission: RE | Admit: 2018-07-31 | Discharge: 2018-07-31 | Disposition: A | Discharge status: Home / Self Care | Payer: Medicaid Other | Source: Ambulatory Visit | Attending: Family Medicine | Admitting: Family Medicine

## 2018-07-31 ENCOUNTER — Ambulatory Visit (INDEPENDENT_AMBULATORY_CARE_PROVIDER_SITE_OTHER): Payer: Medicaid Other | Admitting: Family Medicine

## 2018-07-31 VITALS — BP 112/66 | HR 80 | Temp 98.4°F | Resp 16 | Ht 62.0 in | Wt 163.0 lb

## 2018-07-31 DIAGNOSIS — R109 Unspecified abdominal pain: Secondary | ICD-10-CM | POA: Insufficient documentation

## 2018-07-31 DIAGNOSIS — R350 Frequency of micturition: Secondary | ICD-10-CM | POA: Diagnosis not present

## 2018-07-31 DIAGNOSIS — K5909 Other constipation: Secondary | ICD-10-CM

## 2018-07-31 LAB — URINALYSIS, ROUTINE W REFLEX MICROSCOPIC
BILIRUBIN URINE: NEGATIVE
Hgb urine dipstick: NEGATIVE
Leukocytes, UA: NEGATIVE
Nitrite: NEGATIVE
Protein, ur: NEGATIVE
SPECIFIC GRAVITY, URINE: 1.025 (ref 1.001–1.03)
pH: 7 (ref 5.0–8.0)

## 2018-07-31 LAB — CBC WITH DIFFERENTIAL/PLATELET
BASOS ABS: 41 {cells}/uL (ref 0–200)
BASOS PCT: 0.6 %
EOS PCT: 1.3 %
Eosinophils Absolute: 88 cells/uL (ref 15–500)
HEMATOCRIT: 42.3 % (ref 35.0–45.0)
Hemoglobin: 14.1 g/dL (ref 11.7–15.5)
LYMPHS ABS: 3189 {cells}/uL (ref 850–3900)
MCH: 30.5 pg (ref 27.0–33.0)
MCHC: 33.3 g/dL (ref 32.0–36.0)
MCV: 91.6 fL (ref 80.0–100.0)
MPV: 10.5 fL (ref 7.5–12.5)
Monocytes Relative: 8.9 %
NEUTROS ABS: 2876 {cells}/uL (ref 1500–7800)
Neutrophils Relative %: 42.3 %
Platelets: 222 10*3/uL (ref 140–400)
RBC: 4.62 10*6/uL (ref 3.80–5.10)
RDW: 13 % (ref 11.0–15.0)
Total Lymphocyte: 46.9 %
WBC mixed population: 605 cells/uL (ref 200–950)
WBC: 6.8 10*3/uL (ref 3.8–10.8)

## 2018-07-31 LAB — COMPLETE METABOLIC PANEL WITH GFR
AG Ratio: 1.8 (calc) (ref 1.0–2.5)
ALBUMIN MSPROF: 4.2 g/dL (ref 3.6–5.1)
ALT: 16 U/L (ref 6–29)
AST: 16 U/L (ref 10–30)
Alkaline phosphatase (APISO): 48 U/L (ref 33–115)
BUN: 9 mg/dL (ref 7–25)
CALCIUM: 9.4 mg/dL (ref 8.6–10.2)
CO2: 29 mmol/L (ref 20–32)
CREATININE: 0.96 mg/dL (ref 0.50–1.10)
Chloride: 103 mmol/L (ref 98–110)
GFR, EST NON AFRICAN AMERICAN: 76 mL/min/{1.73_m2} (ref >=60)
GFR, Est African American: 88 mL/min/{1.73_m2} (ref >=60)
GLOBULIN: 2.4 g/dL (ref 1.9–3.7)
Glucose, Bld: 65 mg/dL (ref 65–99)
Potassium: 4.1 mmol/L (ref 3.5–5.3)
SODIUM: 140 mmol/L (ref 135–146)
TOTAL PROTEIN: 6.6 g/dL (ref 6.1–8.1)
Total Bilirubin: 0.3 mg/dL (ref 0.2–1.2)

## 2018-07-31 LAB — LIPASE: LIPASE: 41 U/L (ref 7–60)

## 2018-07-31 MED ORDER — POLYETHYLENE GLYCOL 3350 17 GM/SCOOP PO POWD: 8.5000 g | Freq: Every day | ORAL | 1 refills | Status: AC

## 2018-07-31 NOTE — Patient Instructions (Addendum)
We will call you with your labs  Go to [facility] to get [facility]  Push clear fluids and easy to digest foods  Avoid dairy, like lots of mild, cheese or Yahoo's  Use a stool softener like miralax half a cap-full a day until she is no longer straining with bowel movements  Return if not improved in 2-4 weeks.  May need other tests or GI referral.  If worsening, with severe pain or bloating go to the ER. If continued pain with nausea or vomiting can return to clinic for recheck.   Constipation, Adult Constipation is when a person:  Poops (has a bowel movement) fewer times in a week than normal.  Has a hard time pooping.  Has poop that is dry, hard, or bigger than normal.  Follow these instructions at home: Eating and drinking   Eat foods that have a lot of fiber, such as: ? Fresh fruits and vegetables. ? Whole grains. ? Beans.  Eat less of foods that are high in fat, low in fiber, or overly processed, such as: ? [place] fries. ? Hamburgers. ? Cookies. ? Candy. ? Soda.  Drink enough fluid to keep your pee (urine) clear or pale yellow. General instructions  Exercise regularly or as told by your doctor.  Go to the restroom when you feel like you need to poop. Do not hold it in.  Take over-the-counter and prescription medicines only as told by your doctor. These include any fiber supplements.  Do pelvic floor retraining exercises, such as: ? Doing deep breathing while relaxing your lower belly (abdomen). ? Relaxing your pelvic floor while pooping.  Watch your condition for any changes.  Keep all follow-up visits as told by your doctor. This is important. Contact a doctor if:  You have pain that gets worse.  You have a fever.  You have not pooped for 4 days.  You throw up (vomit).  You are not hungry.  You lose weight.  You are bleeding from the anus.  You have thin, pencil-like poop (stool). Get help right away if:  You have a fever,  and your symptoms suddenly get worse.  You leak poop or have blood in your poop.  Your belly feels hard or bigger than normal (is bloated).  You have very bad belly pain.  You feel dizzy or you faint. This information is not intended to replace advice given to you by your health care provider. Make sure you discuss any questions you have with your health care provider. Document Released: 05/08/2008 Document Revised: 06/09/2016 Document Reviewed: 05/10/2016 Elsevier Interactive Patient Education  2018 Elsevier Inc.  Abdominal Pain, Adult Many things can cause belly (abdominal) pain. Most times, belly pain is not dangerous. Many cases of belly pain can be watched and treated at home. Sometimes belly pain is serious, though. Your doctor will try to find the cause of your belly pain. Follow these instructions at home:  Take over-the-counter and prescription medicines only as told by your doctor. Do not take medicines that help you poop (laxatives) unless told to by your doctor.  Drink enough fluid to keep your pee (urine) clear or pale yellow.  Watch your belly pain for any changes.  Keep all follow-up visits as told by your doctor. This is important. Contact a doctor if:  Your belly pain changes or gets worse.  You are not hungry, or you lose weight without trying.  You are having trouble pooping (constipated) or have watery poop (diarrhea)  for more than 2-3 days.  You have pain when you pee or poop.  Your belly pain wakes you up at night.  Your pain gets worse with meals, after eating, or with certain foods.  You are throwing up and cannot keep anything down.  You have a fever. Get help right away if:  Your pain does not go away as soon as your doctor says it should.  You cannot stop throwing up.  Your pain is only in areas of your belly, such as the right side or the left lower part of the belly.  You have bloody or black poop, or poop that looks like tar.  You have  very bad pain, cramping, or bloating in your belly.  You have signs of not having enough fluid or water in your body (dehydration), such as: ? Dark pee, very little pee, or no pee. ? Cracked lips. ? Dry mouth. ? Sunken eyes. ? Sleepiness. ? Weakness. This information is not intended to replace advice given to you by your health care provider. Make sure you discuss any questions you have with your health care provider. Document Released: 05/08/2008 Document Revised: 06/09/2016 Document Reviewed: 05/03/2016 Elsevier Interactive Patient Education  2018 [employer].

## 2018-07-31 NOTE — Progress Notes (Signed)
Patient ID: Diana Ho, female    DOB: 05-02-1981, 37 y.o.   MRN: [id number]  PCP: Salley Scarlet, MD  Chief Complaint  Patient presents with  . Abd Bloating  . Frequent Urination    Subjective:   Diana Ho is a 37 y.o. female, presents to clinic with CC of abdominal pain and bloating.  Patient has had chronic abdominal discomfort and bloating for several years.  She recently moved from a group home [facility] and return back home with her mother, 2 months ago.  She complains today of abdominal discomfort located in the suprapubic area that occurs intermittently, occurs with eating, with urination and sometimes with bowel movements.  Severity of pain in mild to moderate, but more severe than previous chronic pain.  She also endorses chronic constipation with straining to produce from compact stools but she is having bowel movements every day.  She does have a history of urinary frequency, mother states that she hold her urine too long and chronically gets dysuria.  Patient denies any hematuria, incontinence or urinary frequency.  Denies vaginal complaints.  Patient has been complaining about the intermittent abdominal pain becoming more painful than it normally is for about the last week.  Her mother states that she did drink a whole case of yahoo chocolate milk as well and yesterday had a lot of abdominal discomfort was running to the bathroom over and over again patient states she was having multiple firm small stools.  She denies any diarrhea, hematochezia or melena.  She has had somewhat decreased appetite for the last week and is eating smaller amounts of food than she normally does but she has had no nausea or vomiting.  She also denies any fever, sweats, backaches, indigestion, GERD.  She does have Nexplanon and was diagnosed with early menopause by her OB/GYN, no chance of pregnancy.  History is provided by the patient and by her mother.  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    Patient Active Problem List   Diagnosis Date Noted  . Yeast vaginitis 11/12/2015  . Nexplanon insertion 09/11/2013  . Mental retardation 02/10/2013  . GERD (gastroesophageal reflux disease) 03/06/2012  . High risk sexual behavior 02/11/2012  . Amenorrhea 02/09/2012  . Overweight 02/09/2012  . Encounter for long-term (current) use of medications 02/09/2012  . Bipolar 1 disorder (HCC) 02/09/2012     Prior to Admission medications   Medication Sig Start Date End Date Taking? Authorizing Provider  acetaminophen (TYLENOL) 500 MG tablet Take 1 tablet (500 mg total) by mouth every 6 (six) hours as needed for pain. 10/03/13  Yes [place], Velna Hatchet, MD  ARIPiprazole (ABILIFY) 20 MG tablet Take 1 tablet (20 mg total) by mouth at bedtime. 09/25/13  Yes [place], Velna Hatchet, MD  benztropine (COGENTIN) 0.5 MG tablet Take 0.5 mg by mouth 2 (two) times daily.   Yes [provider]  calcium carbonate (OS-CAL) 600 MG TABS tablet TAKE (1) TABLET BY MOUTH ONCE DAILY. 04/30/18  Yes [place], Velna Hatchet, MD  clonazePAM (KLONOPIN) 0.5 MG tablet Take 0.5 mg by mouth 3 (three) times daily. For nerves   Yes [provider]  divalproex (DEPAKOTE ER) 500 MG 24 hr tablet Take by mouth daily.   Yes [provider]  etonogestrel (NEXPLANON) 68 MG IMPL implant Inject 1 each into the skin once. Placed nov 2014   Yes [provider]  fluticasone (FLONASE) 50 MCG/ACT nasal  spray Place 2 sprays into both nostrils daily. 08/22/17  Yes [place], Velna Hatchet, MD  ibuprofen (ADVIL,MOTRIN) 600 MG tablet Take 1 tablet (600 mg total) by mouth every 6 (six) hours as needed for pain. 10/03/13  Yes [place], Velna Hatchet, MD  nystatin-triamcinolone ointment [facility]) Apply 1 application topically 2 (two) times daily as needed. To affected area. 05/13/18  Yes Tilda Burrow, MD  omeprazole (PRILOSEC) 20 MG capsule TAKE (1) CAPSULE BY MOUTH TWICE  DAILY. 10/03/17  Yes [place], Velna Hatchet, MD  PROVENTIL HFA 108 [phone] [place]) MCG/ACT inhaler INHALE 2 PUFFS BY MOUTH INTO THE LUNGS EVERY 4 HOURS AS NEEDED FOR WHEEZING OR SHORTNESS OF BREATH. 03/08/18  Yes Donita Brooks, MD  traZODone (DESYREL) 150 MG tablet Take 150 mg by mouth at bedtime. 07/21/18  Yes [provider]     No Known Allergies   Family History  Problem Relation Age of Onset  . Diabetes Mother   . COPD Mother   . Hypertension Mother   . Cancer Father   . Liver cancer Maternal Grandfather        etoh  . Anesthesia problems Neg Hx   . Hypotension Neg Hx   . Malignant hyperthermia Neg Hx   . Pseudochol deficiency Neg Hx      Social History   Socioeconomic History  . Marital status: Single    Spouse name: Not on file  . Number of children: 0  . Years of education: Not on file  . Highest education level: Not on file  Occupational History  . Occupation: disabled  Social Needs  . Financial resource strain: Not on file  . Food insecurity:    Worry: Not on file    Inability: Not on file  . Transportation needs:    Medical: Not on file    Non-medical: Not on file  Tobacco Use  . Smoking status: Former Smoker    Types: Cigarettes    Last attempt to quit: 03/13/2009    Years since quitting: 9.3  . Smokeless tobacco: Never Used  . Tobacco comment: Quit x 2 years  Substance and Sexual Activity  . Alcohol use: No  . Drug use: No  . Sexual activity: Not Currently    Birth control/protection: Implant    Comment: multiple partners in the past  Lifestyle  . Physical activity:    Days per week: Not on file    Minutes per session: Not on file  . Stress: Not on file  Relationships  . Social connections:    Talks on phone: Not on file    Gets together: Not on file    Attends religious service: Not on file    Active member of club or organization: Not on file    Attends meetings of clubs or organizations: Not on file    Relationship status: Not on file    . Intimate partner violence:    Fear of current or ex partner: Not on file    Emotionally abused: Not on file    Physically abused: Not on file    Forced sexual activity: Not on file  Other Topics Concern  . Not on file  Social History Narrative   Lives w/ father & step-mother     Review of Systems  Constitutional: Negative for activity change, chills, diaphoresis, fatigue, fever and unexpected weight change.  HENT: Negative.   Eyes: Negative.   Respiratory: Negative.   Cardiovascular: Negative.   Gastrointestinal: Positive for abdominal pain and  constipation. Negative for abdominal distention, anal bleeding, blood in stool, diarrhea, nausea, rectal pain and vomiting.  Endocrine: Negative.   Genitourinary: Positive for dysuria and urgency. Negative for decreased urine volume, difficulty urinating, enuresis, flank pain, frequency, genital sores, hematuria, menstrual problem, pelvic pain, vaginal bleeding, vaginal discharge and vaginal pain.  Musculoskeletal: Negative.   Skin: Negative.  Negative for color change, pallor and rash.  Allergic/Immunologic: Negative.   Neurological: Negative.  Negative for syncope and weakness.  Hematological: Negative.   Psychiatric/Behavioral: Negative.   All other systems reviewed and are negative.      Objective:    Vitals:   07/31/18 1112  BP: 112/66  Pulse: 80  Resp: 16  Temp: 98.4 F (36.9 C)  TempSrc: Oral  SpO2: 100%  Weight: 163 lb (73.9 kg)  Height: 5\' 2"  (1.575 m)      Physical Exam  Constitutional: She appears well-developed and well-nourished. No distress.  HENT:  Head: Normocephalic and atraumatic.  Nose: Nose normal.  Mouth/Throat: Oropharynx is clear and moist.  Eyes: Pupils are equal, round, and reactive to light. Conjunctivae are normal. Right eye exhibits no discharge. Left eye exhibits no discharge. No scleral icterus.  Neck: Normal range of motion. No tracheal deviation present.  Cardiovascular: Normal rate,  regular rhythm, normal heart sounds and intact distal pulses. Exam reveals no gallop and no friction rub.  No murmur heard. Pulmonary/Chest: Effort normal and breath sounds normal. No stridor. No respiratory distress. She has no wheezes. She has no rales.  Abdominal: Soft. Normal appearance and bowel sounds are normal. She exhibits no shifting dullness, no distension, no abdominal bruit, no pulsatile midline mass and no mass. There is no hepatosplenomegaly. There is tenderness (mild with voluntary guarding) in the suprapubic area. There is no rigidity, no rebound, no guarding, no CVA tenderness, no tenderness at McBurney's point and negative Murphy's sign. No hernia.  Genitourinary: Rectum normal. Rectal exam shows no external hemorrhoid, no internal hemorrhoid, no fissure, no mass, no tenderness, anal tone normal and guaiac negative stool.  Genitourinary Comments: DRE done, negative hemoccult Small stool palpated high in rectal vault, no fecal impaction palpated  Musculoskeletal: Normal range of motion.  Neurological: She is alert. She exhibits normal muscle tone. Coordination normal.  Skin: Skin is warm and dry. No rash noted. She is not diaphoretic. No pallor.  Psychiatric: She has a normal mood and affect.  Nursing note and vitals reviewed.   Results for orders placed or performed in visit on 07/31/18  Urinalysis, Routine w reflex microscopic  Result Value Ref Range   Color, Urine YELLOW YELLOW   APPearance CLEAR CLEAR   Specific Gravity, Urine 1.025 1.001 - 1.03   pH 7.0 5.0 - 8.0   Glucose, UA TRACE (A) NEGATIVE   Bilirubin Urine NEGATIVE NEGATIVE   Ketones, ur TRACE (A) NEGATIVE   Hgb urine dipstick NEGATIVE NEGATIVE   Protein, ur NEGATIVE NEGATIVE   Nitrite NEGATIVE NEGATIVE   Leukocytes, UA NEGATIVE NEGATIVE   Negative stool hemoccult      Assessment & Plan:     ICD-10-CM   1. Abdominal pain, unspecified abdominal location R10.9 Urine Culture    DG Abd 1 View    Lipase      CBC with Differential/Platelet    COMPLETE METABOLIC PANEL WITH GFR  2. Frequent urination R35.0 Urinalysis, Routine w reflex microscopic  3. Chronic constipation K59.09 polyethylene glycol powder (GLYCOLAX/MIRALAX) powder    Patient with abdominal complaints, patient has IDD, hx from her somewhat  limited with specific HPI aspects, mother is present concerned with long hx of abdominal bloating and abdominal discomfort, also concerned that she tends to hold her urine for long periods of time giving her dysuria.  Chronic abdominal pain has been worse than her baseline for the past week.  No history of surgeries or other work-ups done in the past.  Some medical history is unknown because patient has been and out of group homes and recently returned to her home to live with her mother 2 months ago. On exam she has mild suprapubic tenderness to palpation without any peritoneal signs and with negative McBurney's point.  Digital rectal exam was unremarkable with small amounts of stool palpated in rectal vault, no fecal impaction palpated.  Urinalysis screening was pertinent for trace glucose and ketones, likely from decreased appetite recently.   Patient did drink a whole case of chocolate milk and has had some abdominal discomfort following this, the most likely etiology of her pain increase her baseline however given the limited history did basic labs, will run urine culture, and obtain KUB.   Lower dose MiraLAX, avoid dairy/fatty foods, mom instructed to remove foods like is that the patient may overeat from accessible areas, increase clear fluids. Obtain KUB.  Follow-up if not improving ER precautions reviewed with the patient and with her mother who verbalizes understanding. She overall was well-appearing with stable vital signs, safe for outpt work up  [employer], PA-C 07/31/18 11:54 AM

## 2018-08-01 LAB — URINE CULTURE
MICRO NUMBER:: 91029739
SPECIMEN QUALITY:: ADEQUATE

## 2018-08-06 ENCOUNTER — Ambulatory Visit: Payer: Medicaid Other | Admitting: Family Medicine

## 2018-09-30 ENCOUNTER — Emergency Department (HOSPITAL_COMMUNITY)
Admission: EM | Admit: 2018-09-30 | Discharge: 2018-09-30 | Disposition: A | Discharge status: Home / Self Care | Payer: Medicaid Other

## 2018-10-01 ENCOUNTER — Ambulatory Visit (INDEPENDENT_AMBULATORY_CARE_PROVIDER_SITE_OTHER): Payer: Medicaid Other | Admitting: Family Medicine

## 2018-10-01 ENCOUNTER — Ambulatory Visit (HOSPITAL_COMMUNITY)
Admission: RE | Admit: 2018-10-01 | Discharge: 2018-10-01 | Disposition: A | Discharge status: Home / Self Care | Payer: Medicaid Other | Source: Ambulatory Visit | Attending: Family Medicine | Admitting: Family Medicine

## 2018-10-01 ENCOUNTER — Encounter: Payer: Self-pay | Admitting: Family Medicine

## 2018-10-01 VITALS — BP 118/70 | HR 104 | Temp 98.6°F | Resp 18 | Wt 165.0 lb

## 2018-10-01 DIAGNOSIS — J189 Pneumonia, unspecified organism: Secondary | ICD-10-CM

## 2018-10-01 DIAGNOSIS — R52 Pain, unspecified: Secondary | ICD-10-CM | POA: Diagnosis not present

## 2018-10-01 DIAGNOSIS — J181 Lobar pneumonia, unspecified organism: Secondary | ICD-10-CM

## 2018-10-01 DIAGNOSIS — R509 Fever, unspecified: Secondary | ICD-10-CM | POA: Diagnosis not present

## 2018-10-01 DIAGNOSIS — R05 Cough: Secondary | ICD-10-CM | POA: Diagnosis present

## 2018-10-01 LAB — INFLUENZA A AND B AG, IMMUNOASSAY
INFLUENZA A ANTIGEN: NOT DETECTED
INFLUENZA B ANTIGEN: NOT DETECTED

## 2018-10-01 MED ORDER — LEVOFLOXACIN 500 MG PO TABS
500.0000 mg | ORAL_TABLET | Freq: Every day | ORAL | 0 refills | Status: DC
Start: 2018-10-01 — End: 2019-11-10

## 2018-10-01 NOTE — Progress Notes (Signed)
Subjective:    Patient ID: Diana Ho, female    DOB: [birth year], 37 y.o.   MRN: [id number]  HPI  Patient is a 37 year old Caucasian female with a history of autism, schizophrenia who presents with a fever x3 days.  Symptoms began over the weekend.  Symptoms include a fever to 102.0.  She is also had a progressively worsening nonproductive cough.  She has audible chest congestion and occasional wheezing.  Patient provides very little history but mother denies any recent travel or known sick contacts.  Also oximetry is 93% on room air.  Patient is unable to stop coughing throughout her encounter.  On examination, there is audible crackles and rales in the left posterior lower lobe concerning for community-acquired pneumonia.  She also has faint expiratory wheezing. Past Medical History:  Diagnosis Date  . Asthma   . Autistic disorder   . Bipolar 1 disorder (HCC)   . Cervical atypia   . GERD (gastroesophageal reflux disease)   . Kidney stones   . Renal disorder   . Schizophrenia [facility])    Past Surgical History:  Procedure Laterality Date  . BIOPSY  03/19/2012   Procedure: BIOPSY;  Surgeon: West Bali, MD;  Location: [facility];  Service: Endoscopy;  Laterality: N/A;  gastric and esophageal biospies  . FOOT SURGERY     as baby  . KIDNEY STONE SURGERY    . SAVORY DILATION  03/19/2012   Procedure: SAVORY DILATION;  Surgeon: West Bali, MD;  Location: [facility];  Service: Endoscopy;  Laterality: N/A;   Current Outpatient Medications on File Prior to Visit  Medication Sig Dispense Refill  . acetaminophen (TYLENOL) 500 MG tablet Take 1 tablet (500 mg total) by mouth every 6 (six) hours as needed for pain. 60 tablet 3  . ARIPiprazole (ABILIFY) 20 MG tablet Take 1 tablet (20 mg total) by mouth at bedtime. 30 tablet 1  . benztropine (COGENTIN) 0.5 MG tablet Take 0.5 mg by mouth 2 (two) times daily.    . calcium carbonate (OS-CAL) 600 MG TABS tablet TAKE (1) TABLET BY MOUTH ONCE DAILY. 30  tablet 0  . clonazePAM (KLONOPIN) 0.5 MG tablet Take 0.5 mg by mouth 3 (three) times daily. For nerves    . divalproex (DEPAKOTE ER) 500 MG 24 hr tablet Take by mouth daily.    Marland Kitchen etonogestrel (NEXPLANON) 68 MG IMPL implant Inject 1 each into the skin once. Placed nov 2014    . fluticasone (FLONASE) 50 MCG/ACT nasal spray Place 2 sprays into both nostrils daily. 16 g 0  . ibuprofen (ADVIL,MOTRIN) 600 MG tablet Take 1 tablet (600 mg total) by mouth every 6 (six) hours as needed for pain. 60 tablet 3  . nystatin-triamcinolone ointment (MYCOLOG) Apply 1 application topically 2 (two) times daily as needed. To affected area. 30 g prn  . omeprazole (PRILOSEC) 20 MG capsule TAKE (1) CAPSULE BY MOUTH TWICE DAILY. 60 capsule 11  . polyethylene glycol powder (GLYCOLAX/MIRALAX) powder Take 8.5 g by mouth daily. 500 g 1  . PROVENTIL HFA 108 (90 Base) MCG/ACT inhaler INHALE 2 PUFFS BY MOUTH INTO THE LUNGS EVERY 4 HOURS AS NEEDED FOR WHEEZING OR SHORTNESS OF BREATH. 6.7 g 0  . traZODone (DESYREL) 150 MG tablet Take 150 mg by mouth at bedtime.  1   No current facility-administered medications on file prior to visit.    No Known Allergies Social History   Socioeconomic History  . Marital status: Single    Spouse  name: Not on file  . Number of children: 0  . Years of education: Not on file  . Highest education level: Not on file  Occupational History  . Occupation: disabled  Social Needs  . Financial resource strain: Not on file  . Food insecurity:    Worry: Not on file    Inability: Not on file  . Transportation needs:    Medical: Not on file    Non-medical: Not on file  Tobacco Use  . Smoking status: Former Smoker    Types: Cigarettes    Last attempt to quit: 03/13/2009    Years since quitting: 9.5  . Smokeless tobacco: Never Used  . Tobacco comment: Quit x 2 years  Substance and Sexual Activity  . Alcohol use: No  . Drug use: No  . Sexual activity: Not Currently    Birth  control/protection: Implant    Comment: multiple partners in the past  Lifestyle  . Physical activity:    Days per week: Not on file    Minutes per session: Not on file  . Stress: Not on file  Relationships  . Social connections:    Talks on phone: Not on file    Gets together: Not on file    Attends religious service: Not on file    Active member of club or organization: Not on file    Attends meetings of clubs or organizations: Not on file    Relationship status: Not on file  . Intimate partner violence:    Fear of current or ex partner: Not on file    Emotionally abused: Not on file    Physically abused: Not on file    Forced sexual activity: Not on file  Other Topics Concern  . Not on file  Social History Narrative   Lives w/ father & step-mother     Review of Systems  All other systems reviewed and are negative.      Objective:   Physical Exam  Constitutional: She appears well-developed and well-nourished. No distress.  HENT:  Left Ear: External ear normal.  Nose: Nose normal.  Mouth/Throat: Oropharynx is clear and moist. No oropharyngeal exudate.  Eyes: Pupils are equal, round, and reactive to light. Conjunctivae are normal.  Cardiovascular: Normal rate, regular rhythm and normal heart sounds.  Pulmonary/Chest: Effort normal. No respiratory distress. She has wheezes. She has rales.  Lymphadenopathy:    She has no cervical adenopathy.  Skin: She is not diaphoretic.  Vitals reviewed.         Assessment & Plan:  Generalized body aches - Plan: Influenza A and B Ag, Immunoassay, DG Chest 2 View  Community acquired pneumonia of left lower lobe of lung (HCC) - Plan: levofloxacin (LEVAQUIN) 500 MG tablet  Flu test today is negative.  Clinical exam is concerning for community-acquired pneumonia.  Begin Levaquin 500 mg p.o. daily for 7 days.  Use albuterol 2 puffs inhaled every 4-6 hours as needed for wheezing.  If wheezing worsens, we may need to add prednisone.   Obtain chest x-ray to evaluate further the extent of the pneumonia.  Recheck in 48 hours or sooner if worse

## 2018-10-15 ENCOUNTER — Other Ambulatory Visit: Payer: Self-pay | Admitting: Family Medicine

## 2019-01-06 ENCOUNTER — Other Ambulatory Visit: Payer: Self-pay | Admitting: *Deleted

## 2019-01-06 MED ORDER — OMEPRAZOLE 20 MG PO CPDR: DELAYED_RELEASE_CAPSULE | ORAL | 4 refills | Status: DC

## 2019-01-29 ENCOUNTER — Emergency Department (HOSPITAL_COMMUNITY)
Admission: EM | Admit: 2019-01-29 | Discharge: 2019-01-29 | Disposition: A | Discharge status: Home / Self Care | Payer: Medicaid Other | Attending: Emergency Medicine | Admitting: Emergency Medicine

## 2019-01-29 ENCOUNTER — Emergency Department (HOSPITAL_COMMUNITY): Payer: Medicaid Other

## 2019-01-29 ENCOUNTER — Other Ambulatory Visit: Payer: Self-pay

## 2019-01-29 DIAGNOSIS — Z79899 Other long term (current) drug therapy: Secondary | ICD-10-CM | POA: Insufficient documentation

## 2019-01-29 DIAGNOSIS — F840 Autistic disorder: Secondary | ICD-10-CM | POA: Insufficient documentation

## 2019-01-29 DIAGNOSIS — Z87891 Personal history of nicotine dependence: Secondary | ICD-10-CM | POA: Insufficient documentation

## 2019-01-29 DIAGNOSIS — S99912A Unspecified injury of left ankle, initial encounter: Secondary | ICD-10-CM | POA: Diagnosis present

## 2019-01-29 DIAGNOSIS — Y9289 Other specified places as the place of occurrence of the external cause: Secondary | ICD-10-CM | POA: Insufficient documentation

## 2019-01-29 DIAGNOSIS — S93402A Sprain of unspecified ligament of left ankle, initial encounter: Secondary | ICD-10-CM | POA: Insufficient documentation

## 2019-01-29 DIAGNOSIS — Y9389 Activity, other specified: Secondary | ICD-10-CM | POA: Insufficient documentation

## 2019-01-29 DIAGNOSIS — X58XXXA Exposure to other specified factors, initial encounter: Secondary | ICD-10-CM | POA: Diagnosis not present

## 2019-01-29 DIAGNOSIS — Y998 Other external cause status: Secondary | ICD-10-CM | POA: Diagnosis not present

## 2019-01-29 MED ORDER — ACETAMINOPHEN 500 MG PO TABS
1000.0000 mg | ORAL_TABLET | Freq: Once | ORAL | Status: AC
  Administered 2019-01-29: 1000 mg via ORAL
  Filled 2019-01-29: qty 2

## 2019-01-29 NOTE — Discharge Instructions (Addendum)
Please use the ankle splint while up and about.  You do not need to sleep in this device.  Use the splint until the pain has improved.  Use Tylenol every 4 hours or ibuprofen every 6 hours for your discomfort.  Please see your primary physician or return to the emergency department if any changes in your condition, problems, or concerns.

## 2019-01-29 NOTE — ED Triage Notes (Signed)
Pt complaining of left foot pain x 3 days, no known injury.

## 2019-01-29 NOTE — ED Provider Notes (Signed)
[facility] EMERGENCY DEPARTMENT Provider Note   CSN: [id number] Arrival date & time: 01/29/19  1220    History   Chief Complaint Chief Complaint  Patient presents with  . Foot Pain    HPI Diana Ho is a 38 y.o. female.     Patient suffers from mental retardation and schizophrenia.  There is also an autistic disorder present.  The mother states that the patient started complaining of the left foot and ankle hurting about 3 days ago.  The mother wrapped it up, she put the patient to rest for a day, but she continues to have pain in this area.  She had to go to the school and picked the patient up and so she came to the emergency department for evaluation today.  The history is provided by a parent.  Foot Pain  Pertinent negatives include no chest pain, no abdominal pain and no shortness of breath.    Past Medical History:  Diagnosis Date  . Asthma   . Autistic disorder   . Bipolar 1 disorder (HCC)   . Cervical atypia   . GERD (gastroesophageal reflux disease)   . Kidney stones   . Renal disorder   . Schizophrenia [facility])     Patient Active Problem List   Diagnosis Date Noted  . Yeast vaginitis 11/12/2015  . Nexplanon insertion 09/11/2013  . Mental retardation 02/10/2013  . GERD (gastroesophageal reflux disease) 03/06/2012  . High risk sexual behavior 02/11/2012  . Amenorrhea 02/09/2012  . Overweight 02/09/2012  . Encounter for long-term (current) use of medications 02/09/2012  . Bipolar 1 disorder (HCC) 02/09/2012    Past Surgical History:  Procedure Laterality Date  . BIOPSY  03/19/2012   Procedure: BIOPSY;  Surgeon: West Bali, MD;  Location: [facility];  Service: Endoscopy;  Laterality: N/A;  gastric and esophageal biospies  . FOOT SURGERY     as baby  . KIDNEY STONE SURGERY    . SAVORY DILATION  03/19/2012   Procedure: SAVORY DILATION;  Surgeon: West Bali, MD;  Location: [facility];  Service: Endoscopy;  Laterality: N/A;     OB History    Gravida        Para      Term      Preterm      AB      Living  0     SAB      TAB      Ectopic      Multiple      Live Births               Home Medications    Prior to Admission medications   Medication Sig Start Date End Date Taking? Authorizing Provider  acetaminophen (TYLENOL) 500 MG tablet Take 1 tablet (500 mg total) by mouth every 6 (six) hours as needed for pain. 10/03/13   Salley Scarlet, MD  ARIPiprazole (ABILIFY) 20 MG tablet Take 1 tablet (20 mg total) by mouth at bedtime. 09/25/13   Salley Scarlet, MD  benztropine (COGENTIN) 0.5 MG tablet Take 0.5 mg by mouth 2 (two) times daily.    [provider]  calcium carbonate (OS-CAL) 600 MG TABS tablet TAKE (1) TABLET BY MOUTH ONCE DAILY. 04/30/18   Keota, Velna Hatchet, MD  clonazePAM (KLONOPIN) 0.5 MG tablet Take 0.5 mg by mouth 3 (three) times daily. For nerves    [provider]  divalproex (DEPAKOTE ER) 500 MG 24 hr tablet Take by mouth  daily.    [provider]  etonogestrel (NEXPLANON) 68 MG IMPL implant Inject 1 each into the skin once. Placed nov 2014    [provider]  fluticasone (FLONASE) 50 MCG/ACT nasal spray Place 2 sprays into both nostrils daily. 08/22/17   Roberta, Velna Hatchet, MD  ibuprofen (ADVIL,MOTRIN) 600 MG tablet Take 1 tablet (600 mg total) by mouth every 6 (six) hours as needed for pain. 10/03/13   Sorrento, Velna Hatchet, MD  levofloxacin (LEVAQUIN) 500 MG tablet Take 1 tablet (500 mg total) by mouth daily. 10/01/18   Donita Brooks, MD  nystatin-triamcinolone ointment [facility]) Apply 1 application topically 2 (two) times daily as needed. To affected area. 05/13/18   Tilda Burrow, MD  omeprazole (PRILOSEC) 20 MG capsule TAKE 1 CAPSULE BY MOUTH TWICE DAILY 01/06/19   Burleson, Velna Hatchet, MD  polyethylene glycol powder [facility]) powder Take 8.5 g by mouth daily. 07/31/18   Danelle Berry, PA-C  PROVENTIL HFA 108 (90 Base) MCG/ACT inhaler INHALE 2 PUFFS BY MOUTH  INTO THE LUNGS EVERY 4 HOURS AS NEEDED FOR WHEEZING OR SHORTNESS OF BREATH. 03/08/18   Donita Brooks, MD  traZODone (DESYREL) 150 MG tablet Take 150 mg by mouth at bedtime. 07/21/18   [provider]    Family History Family History  Problem Relation Age of Onset  . Diabetes Mother   . COPD Mother   . Hypertension Mother   . Cancer Father   . Liver cancer Maternal Grandfather        etoh  . Anesthesia problems Neg Hx   . Hypotension Neg Hx   . Malignant hyperthermia Neg Hx   . Pseudochol deficiency Neg Hx     Social History Social History   Tobacco Use  . Smoking status: Former Smoker    Types: Cigarettes    Last attempt to quit: 03/13/2009    Years since quitting: 9.8  . Smokeless tobacco: Never Used  . Tobacco comment: Quit x 2 years  Substance Use Topics  . Alcohol use: No  . Drug use: No     Allergies   Patient has no known allergies.   Review of Systems Review of Systems  Constitutional: Negative for activity change.       All ROS Neg except as noted in HPI  HENT: Negative for nosebleeds.   Eyes: Negative for photophobia and discharge.  Respiratory: Negative for cough, shortness of breath and wheezing.   Cardiovascular: Negative for chest pain and palpitations.  Gastrointestinal: Negative for abdominal pain and blood in stool.  Genitourinary: Negative for dysuria, frequency and hematuria.  Musculoskeletal: Positive for arthralgias. Negative for back pain and neck pain.  Skin: Negative.   Neurological: Negative for dizziness, seizures and speech difficulty.  Psychiatric/Behavioral: Negative for confusion and hallucinations.     Physical Exam Updated Vital Signs BP 117/68 (BP Location: Right Arm)   Pulse 99   Temp 98.6 F (37 C) (Temporal)   Resp 14   Ht 4\' 9"  (1.448 m)   Wt 77.1 kg   SpO2 96%   BMI 36.79 kg/m   Physical Exam Vitals signs and nursing note reviewed.  Constitutional:      Appearance: She is well-developed. She is not  toxic-appearing.  HENT:     Head: Normocephalic.     Right Ear: Tympanic membrane and external ear normal.     Left Ear: Tympanic membrane and external ear normal.  Eyes:     General: Lids are normal.  Pupils: Pupils are equal, round, and reactive to light.  Neck:     Musculoskeletal: Normal range of motion and neck supple.     Vascular: No carotid bruit.  Cardiovascular:     Rate and Rhythm: Normal rate and regular rhythm.     Pulses: Normal pulses.     Heart sounds: Normal heart sounds.  Pulmonary:     Effort: No respiratory distress.     Breath sounds: Normal breath sounds.  Abdominal:     General: Bowel sounds are normal.     Palpations: Abdomen is soft.     Tenderness: There is no abdominal tenderness. There is no guarding.  Musculoskeletal: Normal range of motion.     Comments: There is full range of motion of the left hip and knee.  There is pain with range of motion with of the left ankle particularly at the dorsum of the foot.  There is no swelling of the medial or lateral malleolus.  There are no lesions between the toes.  There is no puncture wounds of the plantar surface.  The capillary refill is less than 2 seconds.  Lymphadenopathy:     Head:     Right side of head: No submandibular adenopathy.     Left side of head: No submandibular adenopathy.     Cervical: No cervical adenopathy.  Skin:    General: Skin is warm and dry.  Neurological:     Mental Status: She is alert and oriented to person, place, and time.     Cranial Nerves: No cranial nerve deficit.     Sensory: No sensory deficit.  Psychiatric:        Speech: Speech normal.      ED Treatments / Results  Labs (all labs ordered are listed, but only abnormal results are displayed) Labs Reviewed - No data to display  EKG None  Radiology Dg Foot Complete Left  Result Date: 01/29/2019 CLINICAL DATA:  Pain at heel and top of LEFT foot for 2 days, no injury EXAM: LEFT FOOT - COMPLETE 3+ VIEW  COMPARISON:  None FINDINGS: Osseous mineralization normal. Joint spaces preserved. No fracture, dislocation, or bone destruction. IMPRESSION: Normal exam. Electronically Signed   By: Ulyses Southward M.D.   On: 01/29/2019 13:29    Procedures Procedures (including critical care time)  Medications Ordered in ED Medications  acetaminophen (TYLENOL) tablet 1,000 mg (has no administration in time range)     Initial Impression / Assessment and Plan / ED Course  I have reviewed the triage vital signs and the nursing notes.  Pertinent labs & imaging results that were available during my care of the patient were reviewed by me and considered in my medical decision making (see chart for details).          Final Clinical Impressions(s) / ED Diagnoses MDM  Vital signs reviewed.  X-ray is negative for fracture or dislocation of the left foot and ankle.  The patient is fitted with an ankle stirrup splint.  The patient will be treated with Tylenol every 4 hours or ibuprofen every 6 hours.  Patient is to be seen by the primary physician or return to the emergency department if any changes in condition, problems, or concerns.   Final diagnoses:  Sprain of left ankle, unspecified ligament, initial encounter    ED Discharge Orders    None       Ivery Quale, PA-C 01/29/19 1529    Samuel Jester, DO 01/31/19 [phone]

## 2019-02-03 NOTE — Telephone Encounter (Signed)
Note sent to nurse. 

## 2019-08-25 ENCOUNTER — Other Ambulatory Visit: Payer: Self-pay | Admitting: *Deleted

## 2019-08-25 MED ORDER — CLONAZEPAM 0.5 MG PO TABS: 0.5000 mg | ORAL_TABLET | Freq: Three times a day (TID) | ORAL | 0 refills | Status: DC

## 2019-08-25 MED ORDER — ARIPIPRAZOLE 20 MG PO TABS: 20.0000 mg | ORAL_TABLET | Freq: Every day | ORAL | 0 refills | Status: AC

## 2019-08-25 MED ORDER — DIVALPROEX SODIUM ER 500 MG PO TB24: 500.0000 mg | ORAL_TABLET | Freq: Every day | ORAL | 0 refills | Status: AC

## 2019-08-25 MED ORDER — TRAZODONE HCL 150 MG PO TABS: 150.0000 mg | ORAL_TABLET | Freq: Every day | ORAL | 0 refills | Status: AC

## 2019-08-25 MED ORDER — OMEPRAZOLE 20 MG PO CPDR: DELAYED_RELEASE_CAPSULE | ORAL | 0 refills | Status: DC

## 2019-08-25 MED ORDER — BENZTROPINE MESYLATE 0.5 MG PO TABS: 0.5000 mg | ORAL_TABLET | Freq: Two times a day (BID) | ORAL | 0 refills | Status: AC

## 2019-08-25 NOTE — Telephone Encounter (Signed)
I will give 30 DAY SUPPLY

## 2019-08-25 NOTE — Telephone Encounter (Signed)
Received call from patient mother, Otila Kluver.   Reports that they have moved and no longer see the MD at [facility]. States that new provider cannot see her until 09/16/2019.    Requested MD to refill psych meds.

## 2019-08-25 NOTE — Telephone Encounter (Signed)
Call placed to patient and patient mother Diana Ho made aware.

## 2019-09-01 ENCOUNTER — Other Ambulatory Visit: Payer: Self-pay | Admitting: Family Medicine

## 2019-09-25 ENCOUNTER — Other Ambulatory Visit: Payer: Self-pay | Admitting: Family Medicine

## 2019-09-26 NOTE — Telephone Encounter (Signed)
Pt needs OV, I will only give 1 this last refill

## 2019-09-26 NOTE — Telephone Encounter (Signed)
Ok to refill??   Last office visit 10/01/2018.  Last refill 08/25/2019.

## 2019-09-26 NOTE — Telephone Encounter (Signed)
Letter sent.

## 2019-10-22 ENCOUNTER — Other Ambulatory Visit: Payer: Self-pay | Admitting: Family Medicine

## 2019-10-23 ENCOUNTER — Other Ambulatory Visit: Payer: Self-pay | Admitting: Family Medicine

## 2019-10-31 ENCOUNTER — Other Ambulatory Visit: Payer: Self-pay | Admitting: Family Medicine

## 2019-11-03 ENCOUNTER — Telehealth: Payer: Self-pay | Admitting: *Deleted

## 2019-11-03 NOTE — Telephone Encounter (Signed)
Received call from patient mother, Otila Kluver.   Reports refill required for patient's Omeprazole.   Patient last seen on 10/01/2018. Last refill sent in on 09/26/2019. Letter sent to request OV for further refills.   Npo further refills to be given until OV is at least scheduled.   Call placed to patient. No answer. LMTRC on VM.

## 2019-11-04 MED ORDER — OMEPRAZOLE 20 MG PO CPDR: DELAYED_RELEASE_CAPSULE | ORAL | 0 refills | Status: AC

## 2019-11-10 ENCOUNTER — Ambulatory Visit (INDEPENDENT_AMBULATORY_CARE_PROVIDER_SITE_OTHER): Payer: Medicaid Other | Admitting: Family Medicine

## 2019-11-10 ENCOUNTER — Other Ambulatory Visit: Payer: Self-pay

## 2019-11-10 ENCOUNTER — Encounter: Payer: Self-pay | Admitting: Family Medicine

## 2019-11-10 VITALS — BP 128/64 | HR 78 | Temp 98.5°F | Resp 14 | Ht 62.0 in | Wt 173.0 lb

## 2019-11-10 DIAGNOSIS — K219 Gastro-esophageal reflux disease without esophagitis: Secondary | ICD-10-CM | POA: Diagnosis not present

## 2019-11-10 DIAGNOSIS — F79 Unspecified intellectual disabilities: Secondary | ICD-10-CM | POA: Diagnosis not present

## 2019-11-10 DIAGNOSIS — F319 Bipolar disorder, unspecified: Secondary | ICD-10-CM

## 2019-11-10 DIAGNOSIS — E6609 Other obesity due to excess calories: Secondary | ICD-10-CM | POA: Diagnosis not present

## 2019-11-10 DIAGNOSIS — Z23 Encounter for immunization: Secondary | ICD-10-CM | POA: Diagnosis not present

## 2019-11-10 DIAGNOSIS — Z6831 Body mass index (BMI) 31.0-31.9, adult: Secondary | ICD-10-CM
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
## 2019-11-10 NOTE — Assessment & Plan Note (Addendum)
Medications per her psychiatrist.  I will obtain the records from the previous hospitalization along with the recent labs.  We will plan to forward a copy of this hospital admission so that they can use this to help with finding a new physician.

## 2019-11-10 NOTE — Assessment & Plan Note (Signed)
Continue PPI think tends to benefit from this.  Gust dietary changes to try to tweak as she has been gaining weight and this causes increased back pain.

## 2019-11-10 NOTE — Patient Instructions (Signed)
F/U 6 months  Release records  [facility]

## 2019-12-02 ENCOUNTER — Telehealth: Payer: Self-pay | Admitting: Family Medicine
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
## 2019-12-03 NOTE — Telephone Encounter (Signed)
FL2 completed.  

## 2019-12-03 NOTE — Telephone Encounter (Signed)
FL2 completed and patient mother Otila Kluver made aware.   Advised that form is only acceptable <30 days past sign date. States that she will come to office to pick up.

## 2020-01-23 ENCOUNTER — Telehealth: Payer: Self-pay | Admitting: *Deleted

## 2020-01-23 NOTE — Telephone Encounter (Signed)
Call placed to patient and patient step mother Diana Ho made aware.   Will call back with information on facility and fax number.

## 2020-01-23 NOTE — Telephone Encounter (Signed)
Completed.

## 2020-01-23 NOTE — Telephone Encounter (Signed)
Received call from patient step mother Inetta Fermo.   Reports that she requires new FL2 for patient.   Completed and placed on MD desk.

## 2020-02-03 NOTE — Telephone Encounter (Signed)
Call placed to patient Step Mother, Inetta Fermo.   Reports that SW is to come pick up FL2

## 2020-05-10 ENCOUNTER — Ambulatory Visit: Payer: Medicaid Other | Admitting: Family Medicine
